# Patient Record
Sex: Female | Born: 1975 | Race: White | Hispanic: No | Marital: Married | Smoking: Never smoker
Health system: Southern US, Community
[De-identification: ages and names within clinical notes are randomized; demographics above are authoritative.]

## PROBLEM LIST (undated history)

## (undated) ENCOUNTER — Inpatient Hospital Stay (HOSPITAL_COMMUNITY): Payer: Self-pay

## (undated) ENCOUNTER — Emergency Department: Discharge status: Absent without leave | Payer: Self-pay

## (undated) ENCOUNTER — Inpatient Hospital Stay (HOSPITAL_COMMUNITY): Payer: Managed Care, Other (non HMO)

## (undated) DIAGNOSIS — F32A Depression, unspecified: Secondary | ICD-10-CM

## (undated) DIAGNOSIS — F329 Major depressive disorder, single episode, unspecified: Secondary | ICD-10-CM

## (undated) DIAGNOSIS — K219 Gastro-esophageal reflux disease without esophagitis: Secondary | ICD-10-CM

## (undated) DIAGNOSIS — F53 Postpartum depression: Secondary | ICD-10-CM

## (undated) DIAGNOSIS — T7492XA Unspecified child maltreatment, confirmed, initial encounter: Secondary | ICD-10-CM

## (undated) DIAGNOSIS — O99345 Other mental disorders complicating the puerperium: Secondary | ICD-10-CM

## (undated) DIAGNOSIS — Z8742 Personal history of other diseases of the female genital tract: Secondary | ICD-10-CM

## (undated) DIAGNOSIS — N6310 Unspecified lump in the right breast, unspecified quadrant: Secondary | ICD-10-CM

## (undated) DIAGNOSIS — Z87448 Personal history of other diseases of urinary system: Secondary | ICD-10-CM

## (undated) DIAGNOSIS — IMO0002 Reserved for concepts with insufficient information to code with codable children: Secondary | ICD-10-CM

## (undated) DIAGNOSIS — Z87898 Personal history of other specified conditions: Secondary | ICD-10-CM

## (undated) DIAGNOSIS — Z8619 Personal history of other infectious and parasitic diseases: Secondary | ICD-10-CM

## (undated) DIAGNOSIS — J302 Other seasonal allergic rhinitis: Secondary | ICD-10-CM

## (undated) DIAGNOSIS — E669 Obesity, unspecified: Secondary | ICD-10-CM

## (undated) DIAGNOSIS — N915 Oligomenorrhea, unspecified: Secondary | ICD-10-CM

## (undated) DIAGNOSIS — F419 Anxiety disorder, unspecified: Secondary | ICD-10-CM

## (undated) DIAGNOSIS — R112 Nausea with vomiting, unspecified: Secondary | ICD-10-CM

## (undated) DIAGNOSIS — M797 Fibromyalgia: Secondary | ICD-10-CM

## (undated) DIAGNOSIS — IMO0001 Reserved for inherently not codable concepts without codable children: Secondary | ICD-10-CM

## (undated) DIAGNOSIS — O1205 Gestational edema, complicating the puerperium: Secondary | ICD-10-CM

## (undated) DIAGNOSIS — G43909 Migraine, unspecified, not intractable, without status migrainosus: Secondary | ICD-10-CM

## (undated) DIAGNOSIS — J45909 Unspecified asthma, uncomplicated: Secondary | ICD-10-CM

## (undated) DIAGNOSIS — G5600 Carpal tunnel syndrome, unspecified upper limb: Secondary | ICD-10-CM

## (undated) DIAGNOSIS — T7840XA Allergy, unspecified, initial encounter: Secondary | ICD-10-CM

## (undated) DIAGNOSIS — R51 Headache: Secondary | ICD-10-CM

## (undated) DIAGNOSIS — Z87440 Personal history of urinary (tract) infections: Secondary | ICD-10-CM

## (undated) DIAGNOSIS — Z9889 Other specified postprocedural states: Secondary | ICD-10-CM

## (undated) HISTORY — DX: Allergy, unspecified, initial encounter: T78.40XA

## (undated) HISTORY — PX: OTHER SURGICAL HISTORY: SHX169

## (undated) HISTORY — DX: Personal history of other diseases of urinary system: Z87.448

## (undated) HISTORY — PX: BREAST ENHANCEMENT SURGERY: SHX7

## (undated) HISTORY — DX: Personal history of other infectious and parasitic diseases: Z86.19

## (undated) HISTORY — DX: Reserved for concepts with insufficient information to code with codable children: IMO0002

## (undated) HISTORY — DX: Postpartum depression: F53.0

## (undated) HISTORY — PX: ANKLE SURGERY: SHX546

## (undated) HISTORY — PX: CERVICAL CERCLAGE: SHX1329

## (undated) HISTORY — DX: Depression, unspecified: F32.A

## (undated) HISTORY — PX: DILATION AND CURETTAGE OF UTERUS: SHX78

## (undated) HISTORY — DX: Personal history of other diseases of the female genital tract: Z87.42

## (undated) HISTORY — PX: COSMETIC SURGERY: SHX468

## (undated) HISTORY — PX: BREAST SURGERY: SHX581

## (undated) HISTORY — DX: Oligomenorrhea, unspecified: N91.5

## (undated) HISTORY — DX: Unspecified asthma, uncomplicated: J45.909

## (undated) HISTORY — DX: Unspecified child maltreatment, confirmed, initial encounter: T74.92XA

## (undated) HISTORY — DX: Obesity, unspecified: E66.9

## (undated) HISTORY — DX: Gestational edema, complicating the puerperium: O12.05

## (undated) HISTORY — PX: NASAL SINUS SURGERY: SHX719

## (undated) HISTORY — PX: LYMPH NODE DISSECTION: SHX5087

## (undated) HISTORY — DX: Unspecified lump in the right breast, unspecified quadrant: N63.10

## (undated) HISTORY — DX: Personal history of urinary (tract) infections: Z87.440

## (undated) HISTORY — PX: AUGMENTATION MAMMAPLASTY: SUR837

## (undated) HISTORY — DX: Other mental disorders complicating the puerperium: O99.345

## (undated) HISTORY — DX: Major depressive disorder, single episode, unspecified: F32.9

## (undated) HISTORY — DX: Migraine, unspecified, not intractable, without status migrainosus: G43.909

## (undated) HISTORY — DX: Personal history of other specified conditions: Z87.898

---

## 2002-03-25 ENCOUNTER — Ambulatory Visit (HOSPITAL_COMMUNITY): Admission: RE | Admit: 2002-03-25 | Discharge: 2002-03-25 | Payer: Self-pay | Admitting: Orthopedic Surgery

## 2002-03-27 ENCOUNTER — Observation Stay (HOSPITAL_COMMUNITY): Admission: AD | Admit: 2002-03-27 | Discharge: 2002-03-28 | Payer: Self-pay | Admitting: Orthopedic Surgery

## 2002-03-27 ENCOUNTER — Encounter: Payer: Self-pay | Admitting: Orthopedic Surgery

## 2002-03-27 ENCOUNTER — Ambulatory Visit (HOSPITAL_COMMUNITY): Admission: RE | Admit: 2002-03-27 | Discharge: 2002-03-27 | Payer: Self-pay | Admitting: Orthopedic Surgery

## 2002-03-28 ENCOUNTER — Encounter: Payer: Self-pay | Admitting: Orthopedic Surgery

## 2002-03-31 ENCOUNTER — Ambulatory Visit (HOSPITAL_COMMUNITY): Admission: RE | Admit: 2002-03-31 | Discharge: 2002-04-01 | Payer: Self-pay | Admitting: Orthopedic Surgery

## 2002-11-24 DIAGNOSIS — R87619 Unspecified abnormal cytological findings in specimens from cervix uteri: Secondary | ICD-10-CM

## 2002-11-24 DIAGNOSIS — IMO0002 Reserved for concepts with insufficient information to code with codable children: Secondary | ICD-10-CM

## 2002-11-24 HISTORY — DX: Unspecified abnormal cytological findings in specimens from cervix uteri: R87.619

## 2002-11-24 HISTORY — DX: Reserved for concepts with insufficient information to code with codable children: IMO0002

## 2003-07-28 ENCOUNTER — Other Ambulatory Visit: Admission: RE | Admit: 2003-07-28 | Discharge: 2003-07-28 | Payer: Self-pay | Admitting: Obstetrics and Gynecology

## 2003-11-25 DIAGNOSIS — F419 Anxiety disorder, unspecified: Secondary | ICD-10-CM

## 2003-11-25 HISTORY — DX: Anxiety disorder, unspecified: F41.9

## 2003-12-25 ENCOUNTER — Inpatient Hospital Stay (HOSPITAL_COMMUNITY): Admission: AD | Admit: 2003-12-25 | Discharge: 2003-12-25 | Payer: Self-pay | Admitting: Obstetrics and Gynecology

## 2003-12-26 ENCOUNTER — Inpatient Hospital Stay (HOSPITAL_COMMUNITY): Admission: AD | Admit: 2003-12-26 | Discharge: 2003-12-26 | Payer: Self-pay | Admitting: Obstetrics and Gynecology

## 2003-12-29 ENCOUNTER — Inpatient Hospital Stay (HOSPITAL_COMMUNITY): Admission: AD | Admit: 2003-12-29 | Discharge: 2003-12-29 | Payer: Self-pay | Admitting: Obstetrics and Gynecology

## 2004-01-08 ENCOUNTER — Inpatient Hospital Stay (HOSPITAL_COMMUNITY): Admission: AD | Admit: 2004-01-08 | Discharge: 2004-01-08 | Payer: Self-pay | Admitting: Obstetrics and Gynecology

## 2004-01-11 ENCOUNTER — Encounter (INDEPENDENT_AMBULATORY_CARE_PROVIDER_SITE_OTHER): Payer: Self-pay | Admitting: *Deleted

## 2004-01-11 ENCOUNTER — Inpatient Hospital Stay (HOSPITAL_COMMUNITY): Admission: AD | Admit: 2004-01-11 | Discharge: 2004-01-13 | Payer: Self-pay | Admitting: Obstetrics and Gynecology

## 2004-01-23 ENCOUNTER — Encounter: Admission: RE | Admit: 2004-01-23 | Discharge: 2004-02-22 | Payer: Self-pay | Admitting: Obstetrics and Gynecology

## 2004-02-22 DIAGNOSIS — O1205 Gestational edema, complicating the puerperium: Secondary | ICD-10-CM

## 2004-02-22 HISTORY — DX: Gestational edema, complicating the puerperium: O12.05

## 2004-08-05 ENCOUNTER — Other Ambulatory Visit: Admission: RE | Admit: 2004-08-05 | Discharge: 2004-08-05 | Payer: Self-pay | Admitting: Obstetrics and Gynecology

## 2004-11-24 DIAGNOSIS — N6310 Unspecified lump in the right breast, unspecified quadrant: Secondary | ICD-10-CM

## 2004-11-24 HISTORY — DX: Unspecified lump in the right breast, unspecified quadrant: N63.10

## 2005-06-09 ENCOUNTER — Encounter: Admission: RE | Admit: 2005-06-09 | Discharge: 2005-06-09 | Payer: Self-pay | Admitting: Obstetrics and Gynecology

## 2005-11-24 DIAGNOSIS — N915 Oligomenorrhea, unspecified: Secondary | ICD-10-CM

## 2005-11-24 DIAGNOSIS — Z87898 Personal history of other specified conditions: Secondary | ICD-10-CM

## 2005-11-24 DIAGNOSIS — Z8742 Personal history of other diseases of the female genital tract: Secondary | ICD-10-CM

## 2005-11-24 HISTORY — DX: Personal history of other diseases of the female genital tract: Z87.42

## 2005-11-24 HISTORY — DX: Personal history of other specified conditions: Z87.898

## 2005-11-24 HISTORY — DX: Oligomenorrhea, unspecified: N91.5

## 2006-03-17 ENCOUNTER — Other Ambulatory Visit: Admission: RE | Admit: 2006-03-17 | Discharge: 2006-03-17 | Payer: Self-pay | Admitting: Obstetrics and Gynecology

## 2006-11-24 DIAGNOSIS — F53 Postpartum depression: Secondary | ICD-10-CM

## 2006-11-24 HISTORY — DX: Postpartum depression: F53.0

## 2007-03-12 ENCOUNTER — Ambulatory Visit (HOSPITAL_COMMUNITY): Admission: RE | Admit: 2007-03-12 | Discharge: 2007-03-12 | Payer: Self-pay | Admitting: Obstetrics and Gynecology

## 2007-03-19 ENCOUNTER — Inpatient Hospital Stay (HOSPITAL_COMMUNITY): Admission: AD | Admit: 2007-03-19 | Discharge: 2007-03-19 | Payer: Self-pay | Admitting: Obstetrics and Gynecology

## 2007-05-17 ENCOUNTER — Inpatient Hospital Stay (HOSPITAL_COMMUNITY): Admission: AD | Admit: 2007-05-17 | Discharge: 2007-05-17 | Payer: Self-pay | Admitting: Obstetrics and Gynecology

## 2007-06-24 ENCOUNTER — Inpatient Hospital Stay (HOSPITAL_COMMUNITY): Admission: AD | Admit: 2007-06-24 | Discharge: 2007-06-26 | Payer: Self-pay | Admitting: Obstetrics and Gynecology

## 2007-07-03 ENCOUNTER — Inpatient Hospital Stay (HOSPITAL_COMMUNITY): Admission: AD | Admit: 2007-07-03 | Discharge: 2007-07-03 | Payer: Self-pay | Admitting: Obstetrics and Gynecology

## 2007-07-07 ENCOUNTER — Inpatient Hospital Stay (HOSPITAL_COMMUNITY): Admission: AD | Admit: 2007-07-07 | Discharge: 2007-07-07 | Payer: Self-pay | Admitting: Obstetrics and Gynecology

## 2007-07-09 ENCOUNTER — Inpatient Hospital Stay (HOSPITAL_COMMUNITY): Admission: AD | Admit: 2007-07-09 | Discharge: 2007-07-09 | Payer: Self-pay | Admitting: Obstetrics and Gynecology

## 2007-07-23 ENCOUNTER — Inpatient Hospital Stay (HOSPITAL_COMMUNITY): Admission: AD | Admit: 2007-07-23 | Discharge: 2007-08-24 | Payer: Self-pay | Admitting: Obstetrics and Gynecology

## 2007-08-21 ENCOUNTER — Encounter (INDEPENDENT_AMBULATORY_CARE_PROVIDER_SITE_OTHER): Payer: Self-pay | Admitting: Obstetrics and Gynecology

## 2007-08-25 ENCOUNTER — Encounter: Admission: RE | Admit: 2007-08-25 | Discharge: 2007-09-24 | Payer: Self-pay | Admitting: Obstetrics and Gynecology

## 2007-09-25 ENCOUNTER — Encounter: Admission: RE | Admit: 2007-09-25 | Discharge: 2007-10-24 | Payer: Self-pay | Admitting: Obstetrics and Gynecology

## 2007-10-25 ENCOUNTER — Encounter: Admission: RE | Admit: 2007-10-25 | Discharge: 2007-11-24 | Payer: Self-pay | Admitting: Obstetrics and Gynecology

## 2007-11-25 ENCOUNTER — Encounter: Admission: RE | Admit: 2007-11-25 | Discharge: 2007-12-25 | Payer: Self-pay | Admitting: Obstetrics and Gynecology

## 2007-12-26 ENCOUNTER — Encounter: Admission: RE | Admit: 2007-12-26 | Discharge: 2008-01-22 | Payer: Self-pay | Admitting: Obstetrics and Gynecology

## 2008-01-23 ENCOUNTER — Encounter: Admission: RE | Admit: 2008-01-23 | Discharge: 2008-02-21 | Payer: Self-pay | Admitting: Obstetrics and Gynecology

## 2008-02-23 ENCOUNTER — Encounter: Admission: RE | Admit: 2008-02-23 | Discharge: 2008-03-23 | Payer: Self-pay | Admitting: Obstetrics and Gynecology

## 2008-03-24 ENCOUNTER — Encounter: Admission: RE | Admit: 2008-03-24 | Discharge: 2008-04-23 | Payer: Self-pay | Admitting: Obstetrics and Gynecology

## 2008-04-24 ENCOUNTER — Encounter: Admission: RE | Admit: 2008-04-24 | Discharge: 2008-05-23 | Payer: Self-pay | Admitting: Obstetrics and Gynecology

## 2008-05-24 ENCOUNTER — Encounter: Admission: RE | Admit: 2008-05-24 | Discharge: 2008-06-23 | Payer: Self-pay | Admitting: Obstetrics and Gynecology

## 2008-06-24 ENCOUNTER — Encounter: Admission: RE | Admit: 2008-06-24 | Discharge: 2008-07-24 | Payer: Self-pay | Admitting: Obstetrics and Gynecology

## 2009-05-06 IMAGING — US US OB TRANSVAGINAL
1 series · 14 of 28 positions shown · non-contrast
Comparison: none

OBSTETRICAL ULTRASOUND:

 This ultrasound exam was performed in the [HOSPITAL] Ultrasound Department.  The OB US report was generated in the AS system, and faxed to the ordering physician.  This report is also available in [REDACTED] PACS.

[Series 1: us ob transvaginal · 0.33mm/px · 14 of 31 slices shown]
[im 2/31]
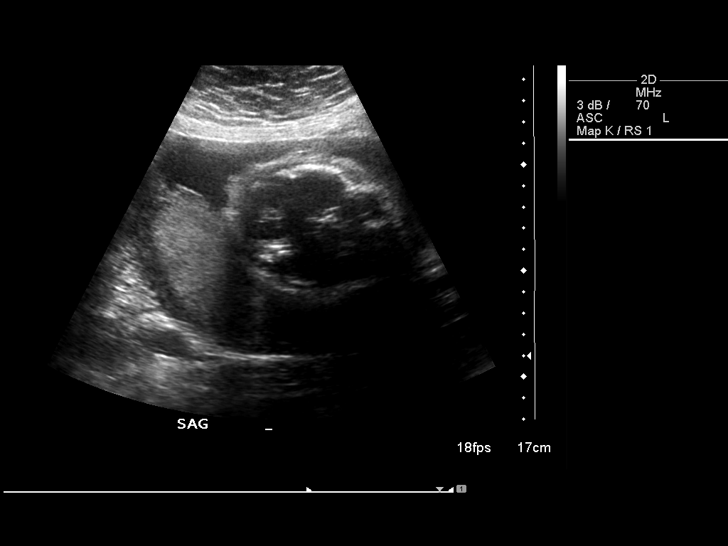
[im 4/31]
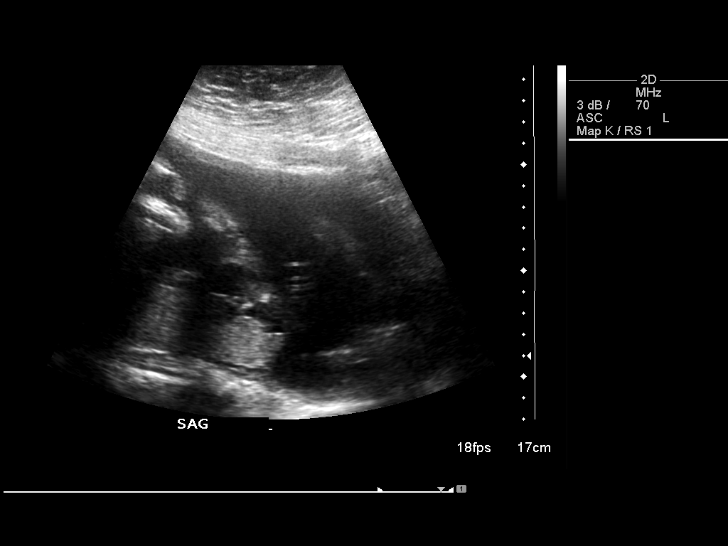
[im 6/31]
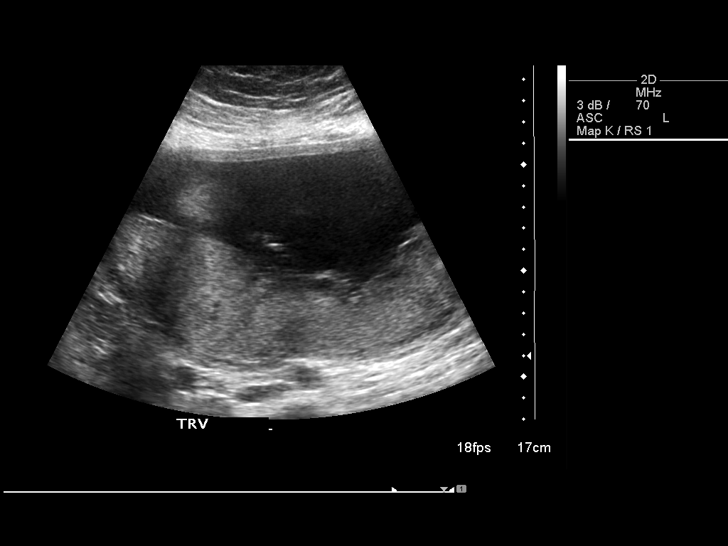
[im 8/31]
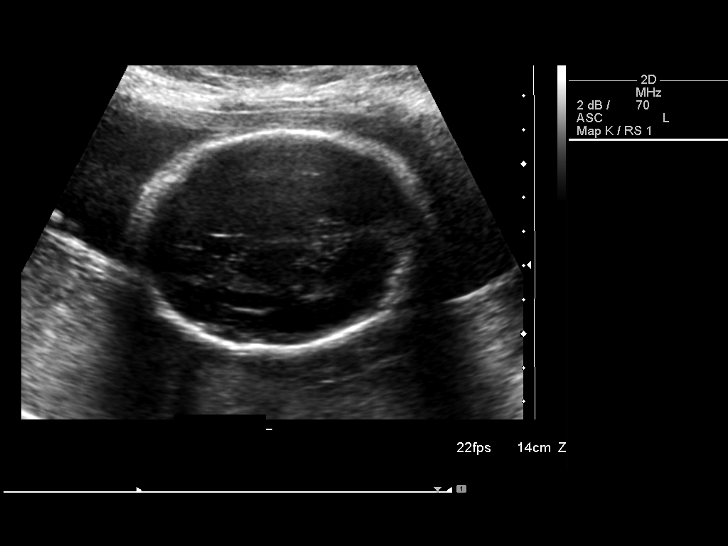
[im 11/31]
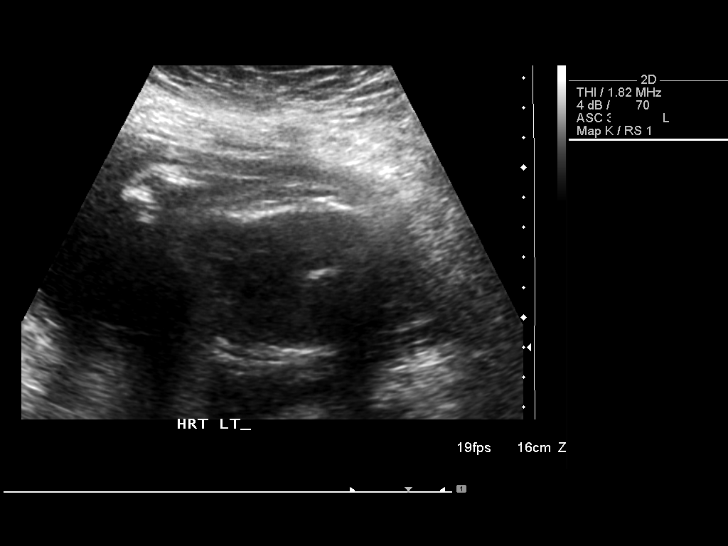
[im 13/31]
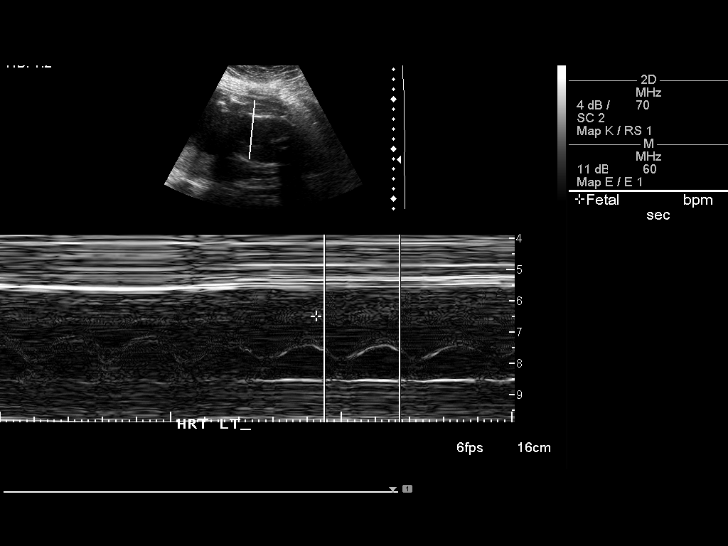
[im 15/31]
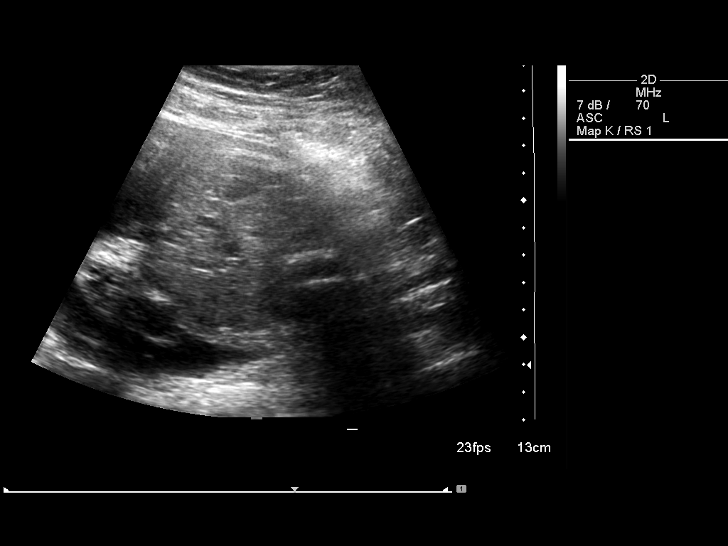
[im 17/31]
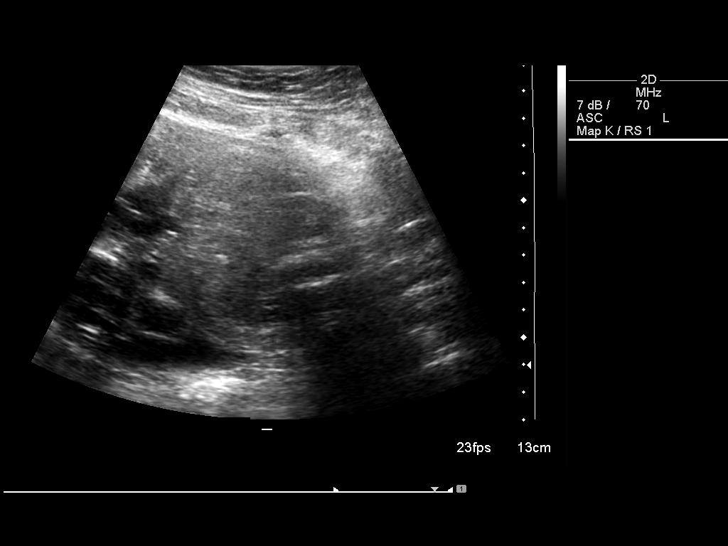
[im 19/31]
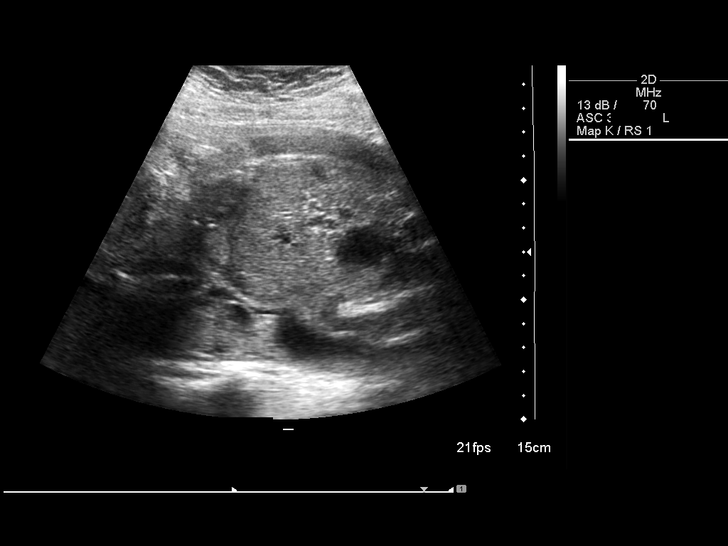
[im 22/31]
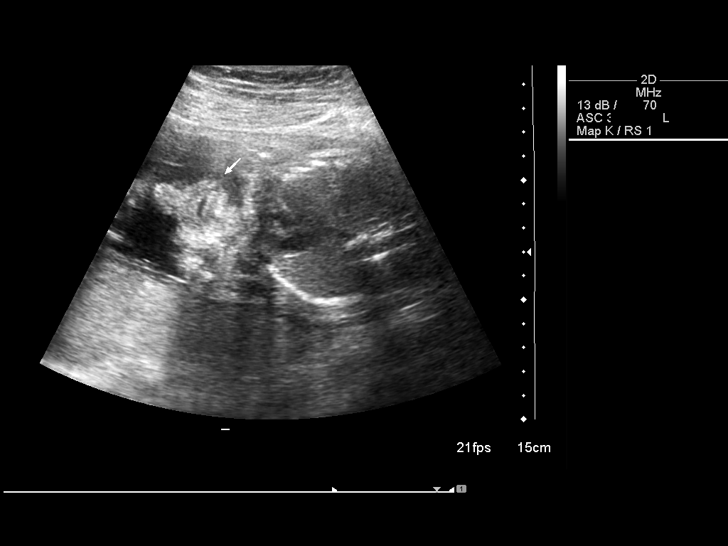
[im 24/31]
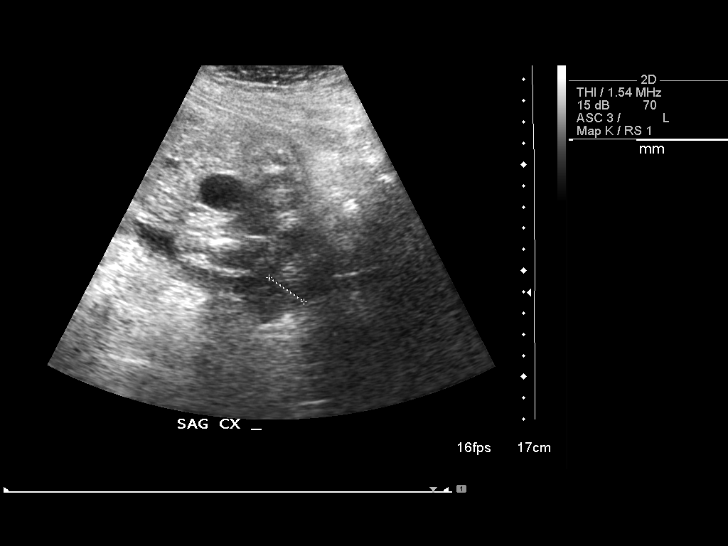
[im 26/31]
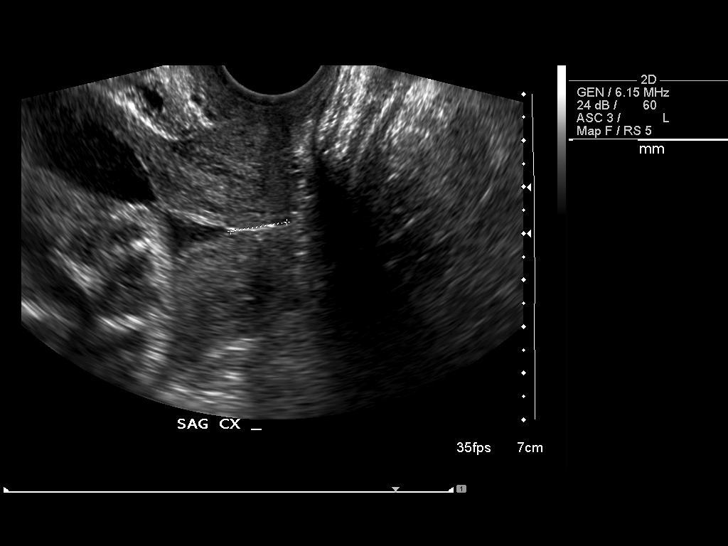
[im 28/31]
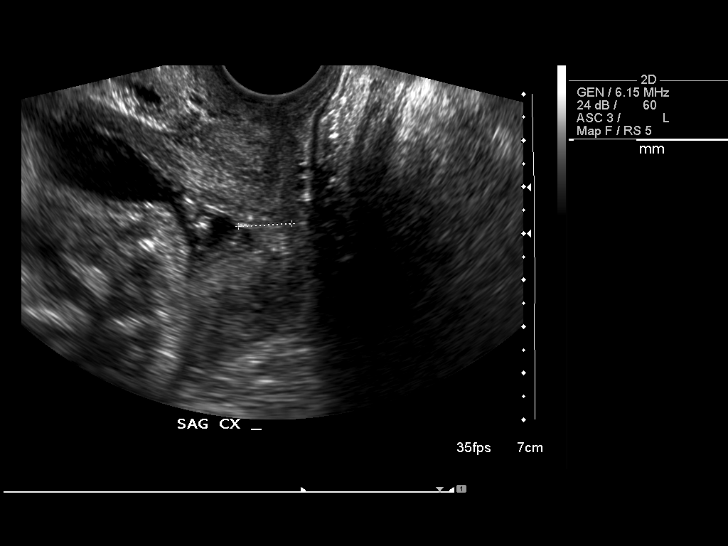
[im 31/31]
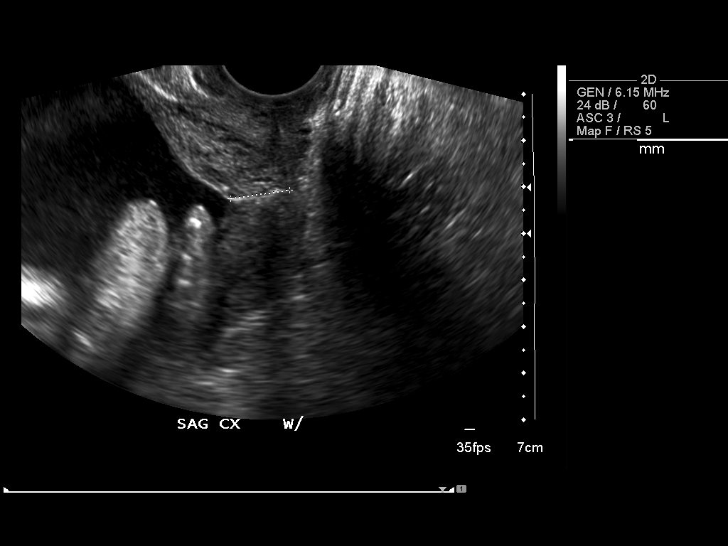

[14 of 28 positions shown; findings below may reference images not displayed]

IMPRESSION: See AS Obstetric US report.

## 2009-09-10 ENCOUNTER — Encounter: Admission: RE | Admit: 2009-09-10 | Discharge: 2009-09-10 | Payer: Self-pay | Admitting: Obstetrics and Gynecology

## 2010-04-28 ENCOUNTER — Ambulatory Visit: Payer: Self-pay | Admitting: Diagnostic Radiology

## 2010-04-28 ENCOUNTER — Emergency Department (HOSPITAL_BASED_OUTPATIENT_CLINIC_OR_DEPARTMENT_OTHER): Admission: EM | Admit: 2010-04-28 | Discharge: 2010-04-28 | Payer: Self-pay | Admitting: Emergency Medicine

## 2010-06-10 ENCOUNTER — Inpatient Hospital Stay (HOSPITAL_COMMUNITY): Admission: AD | Admit: 2010-06-10 | Discharge: 2010-06-10 | Payer: Self-pay | Admitting: Obstetrics & Gynecology

## 2011-01-15 ENCOUNTER — Encounter: Payer: Self-pay | Admitting: Family Medicine

## 2011-01-15 ENCOUNTER — Ambulatory Visit (INDEPENDENT_AMBULATORY_CARE_PROVIDER_SITE_OTHER): Payer: Managed Care, Other (non HMO) | Admitting: Family Medicine

## 2011-01-15 DIAGNOSIS — R300 Dysuria: Secondary | ICD-10-CM | POA: Insufficient documentation

## 2011-01-15 DIAGNOSIS — F959 Tic disorder, unspecified: Secondary | ICD-10-CM | POA: Insufficient documentation

## 2011-01-15 DIAGNOSIS — L738 Other specified follicular disorders: Secondary | ICD-10-CM

## 2011-01-15 LAB — CONVERTED CEMR LAB
Bilirubin Urine: NEGATIVE
Glucose, Urine, Semiquant: NEGATIVE
Ketones, urine, test strip: NEGATIVE
pH: 7

## 2011-01-16 ENCOUNTER — Encounter: Payer: Self-pay | Admitting: Family Medicine

## 2011-01-21 NOTE — Assessment & Plan Note (Signed)
Summary: LYMPHNODE UNDER ARM AND EYE TWITCHING/NH rm 4   Vital Signs:  Patient Profile:   35 Years Old Female CC:      eye twitching, lymph node swollen Height:     68 inches Weight:      245 pounds O2 Sat:      99 % O2 treatment:    Room Air Temp:     98.9 degrees F oral Pulse rate:   80 / minute Resp:     16 per minute BP sitting:   129 / 55  (left arm) Cuff size:   regular  Vitals Entered By: Clemens Catholic LPN (January 15, 2011 9:11 AM)              Vision Screening: Left eye w/o correction: 20 / 20 Right Eye w/o correction: 20 / 20 Both eyes w/o correction:  20/ 20        Vision Entered By: Clemens Catholic LPN (January 15, 2011 10:17 AM)    Updated Prior Medication List: PROTONIX 40 MG TBEC (PANTOPRAZOLE SODIUM)   Current Allergies: No known allergies History of Present Illness Chief Complaint: eye twitching, lymph node swollen History of Present Illness:  Subjective:  Patient presents with several problems: 1)  She has noticed her left eyelids twitching intermittently for about 5 days without eye discomfort or changes in vision.  No other neurologic symptoms.  She reports that she had a severe headache in January and visited ER.  CT scan of head at that time was normal.  2)  She has noticed some soreness in her left axilla superficially.  She had some drainage from a small lesion in that area several weeks ago but a wound culture was negative and symptoms resolved at that time.  She states that at age 39 she had a cat scratch and subsequently underwent resection of an enlarged left axillary node.  No recent cat injuries.  She feels well.  No fevers, chills, and sweats  3)  She believes she is developing an early cystitis.  She has had mild dysuria, urgency, and frequency.  No back ache, abdominal pain or pelvic pain.  Last menstrual period 3.5 weeks ago and normal.  REVIEW OF SYSTEMS Constitutional Symptoms      Denies fever, chills, night sweats, weight  loss, weight gain, and fatigue.  Eyes       Complains of eye pain.      Denies change in vision, eye discharge, glasses, contact lenses, and eye surgery. Ear/Nose/Throat/Mouth       Denies hearing loss/aids, change in hearing, ear pain, ear discharge, dizziness, frequent runny nose, frequent nose bleeds, sinus problems, sore throat, hoarseness, and tooth pain or bleeding.  Respiratory       Denies dry cough, productive cough, wheezing, shortness of breath, asthma, bronchitis, and emphysema/COPD.  Cardiovascular       Denies murmurs, chest pain, and tires easily with exhertion.    Gastrointestinal       Denies stomach pain, nausea/vomiting, diarrhea, constipation, blood in bowel movements, and indigestion. Genitourniary       Denies painful urination, kidney stones, and loss of urinary control. Neurological       Denies paralysis, seizures, and fainting/blackouts. Musculoskeletal       Denies muscle pain, joint pain, joint stiffness, decreased range of motion, redness, swelling, muscle weakness, and gout.  Skin       Denies bruising, unusual mles/lumps or sores, and hair/skin or nail changes.  Psych  Denies mood changes, temper/anger issues, anxiety/stress, speech problems, depression, and sleep problems. Blood-Lymph       Complains of unexplained lumps. Other Comments: pt c/o LT eye twitching x 6 days, lymph node under LT arm tender( she was seen at [facility] 12/11' and had it I &D negative culture), and she is also having vaginal irritation,burning and a little cramping. she is trying to get pregnant but does not want [place] to do a UPT.   Past History:  Past Medical History: none  Past Surgical History: lymph node removal  1981- cat scratch fever  Family History: none  Social History: Never Smoked Alcohol use-yes 1 per mth Drug use-no Smoking Status:  never Drug Use:  no   Objective:  Appearance:  Patient is obese but otherwise appears healthy, stated age,  and in no acute distress  Eyes:  Pupils are equal, round, and reactive to light and accomdation.  Extraocular movement is intact.  Conjunctivae are not inflamed.  No eyelid tics are present.  No lid tenderness or swelling.  Fundi normal.  No nystagmus.  Vision normal Ears:  Canals normal.  Tympanic membranes normal.   Nose:  Normal septum.  Normal turbinates, mildly congested.   No sinus tenderness present.  Pharynx:  Normal  Neck:  Supple.  No adenopathy is present.  No thyromegaly is present  Lungs:  Clear to auscultation.  Breath sounds are equal.  Heart:  Regular rate and rhythm without murmurs, rubs, or gallops.  Abdomen:  Obese, nontender without masses or hepatosplenomegaly.  Bowel sounds are present.  No CVA or flank tenderness.  Extremities:  No edema.  Neurologic:  Cranial nerves 2 through 12 are normal.  Patellar, achilles, and elbow reflexes are normal.  Cerebellar function is intact.    Skin:  In the left axilla there is some vague superficial tenderness but no erythema or swelling.  No adenopathy is palpated.  Note that patient shaves both axillae.  No tenderness right axillae; no palpable nodes. urinalysis (dipstick): trace blood, 1+ leuks Assessment New Problems: FOLLICULITIS (ICD-704.8) TIC (ICD-307.20) DYSURIA (ICD-788.1)  NORMAL EYE AND NEUROLOGIC EXAM.  ? REFRACTIVE ERROR SUSPECT MILD AXILLARY FOLLICULITIS  Plan New Medications/Changes: DIFLUCAN 150 MG TABS (FLUCONAZOLE) One by mouth as a single dose.  May repeat in 3 to 4 days.  #1 x 1, 01/15/2011, Donna Christen MD CEPHALEXIN 500 MG CAPS (CEPHALEXIN) One by mouth two times a day  #20 x 0, 01/15/2011, Donna Christen MD  New Orders: T-Culture, Urine [[id number]] Urinalysis [CPT-81003] New Patient Level IV [99204] Planning Comments:   Urine culture pending  Begin Keflex (adjust accordingly after urine culture available) to cover folliculitis and UTI Recommend follow-up with ophthalmologist.   The patient and/or  caregiver has been counseled thoroughly with regard to medications prescribed including dosage, schedule, interactions, rationale for use, and possible side effects and they verbalize understanding.  Diagnoses and expected course of recovery discussed and will return if not improved as expected or if the condition worsens. Patient and/or caregiver verbalized understanding.  Prescriptions: DIFLUCAN 150 MG TABS (FLUCONAZOLE) One by mouth as a single dose.  May repeat in 3 to 4 days.  #1 x 1   Entered and Authorized by:   Donna Christen MD   Signed by:   Donna Christen MD on 01/15/2011   Method used:   Print then Give to Patient   RxID:   [id number] CEPHALEXIN 500 MG CAPS (CEPHALEXIN) One by mouth two times a day  #20 x  0   Entered and Authorized by:   Donna Christen MD   Signed by:   Donna Christen MD on 01/15/2011   Method used:   Print then Give to Patient   RxID:   [id number]   Orders Added: 1)  T-Culture, Urine [[id number]] 2)  Urinalysis [CPT-81003] 3)  New Patient Level IV [99204]    Laboratory Results   Urine Tests  Date/Time Received: January 15, 2011 9:25 AM  Date/Time Reported: January 15, 2011 9:25 AM   Routine Urinalysis   Color: yellow Appearance: Clear Glucose: negative   (Normal Range: Negative) Bilirubin: negative   (Normal Range: Negative) Ketone: negative   (Normal Range: Negative) Spec. Gravity: 1.025   (Normal Range: 1.003-1.035) Blood: trace-intact   (Normal Range: Negative) pH: 7.0   (Normal Range: 5.0-8.0) Protein: negative   (Normal Range: Negative) Urobilinogen: 0.2   (Normal Range: 0-1) Nitrite: negative   (Normal Range: Negative) Leukocyte Esterace: 1+   (Normal Range: Negative)

## 2011-02-08 LAB — URINE CULTURE: Colony Count: 30000

## 2011-02-08 LAB — URINALYSIS, ROUTINE W REFLEX MICROSCOPIC
Ketones, ur: NEGATIVE mg/dL
Nitrite: NEGATIVE
Protein, ur: NEGATIVE mg/dL
pH: 6.5 (ref 5.0–8.0)

## 2011-02-08 LAB — WET PREP, GENITAL: Yeast Wet Prep HPF POC: NONE SEEN

## 2011-02-08 LAB — URINE MICROSCOPIC-ADD ON

## 2011-02-10 LAB — POCT CARDIAC MARKERS
CKMB, poc: 2 ng/mL (ref 1.0–8.0)
Troponin i, poc: <0.05 ng/mL (ref 0.00–0.09)

## 2011-02-10 LAB — DIFFERENTIAL
Basophils Relative: 1 % (ref 0–1)
Eosinophils Relative: 3 % (ref 0–5)
Lymphs Abs: 2 10*3/uL (ref 0.7–4.0)
Neutrophils Relative %: 57 % (ref 43–77)

## 2011-02-10 LAB — BASIC METABOLIC PANEL
BUN: 16 mg/dL (ref 6–23)
CO2: 29 mEq/L (ref 19–32)
GFR calc Af Amer: >60 mL/min (ref >60)
GFR calc non Af Amer: >60 mL/min (ref >60)
Glucose, Bld: 78 mg/dL (ref 70–99)
Potassium: 4.4 mEq/L (ref 3.5–5.1)

## 2011-02-10 LAB — CBC
HCT: 41.2 % (ref 36.0–46.0)
Hemoglobin: 13.8 g/dL (ref 12.0–15.0)
MCHC: 33.5 g/dL (ref 30.0–36.0)
MCV: 85.7 fL (ref 78.0–100.0)
RBC: 4.81 MIL/uL (ref 3.87–5.11)

## 2011-02-10 LAB — D-DIMER, QUANTITATIVE: D-Dimer, Quant: 0.48 ug/mL-FEU (ref 0.00–0.48)

## 2011-02-23 ENCOUNTER — Inpatient Hospital Stay (HOSPITAL_COMMUNITY)
Admission: AD | Admit: 2011-02-23 | Discharge: 2011-02-23 | Disposition: A | Discharge status: Home / Self Care | Payer: Managed Care, Other (non HMO) | Source: Ambulatory Visit | Attending: Obstetrics and Gynecology | Admitting: Obstetrics and Gynecology

## 2011-02-23 ENCOUNTER — Inpatient Hospital Stay (HOSPITAL_COMMUNITY): Payer: Managed Care, Other (non HMO)

## 2011-02-23 DIAGNOSIS — O209 Hemorrhage in early pregnancy, unspecified: Secondary | ICD-10-CM | POA: Insufficient documentation

## 2011-02-23 LAB — URINALYSIS, ROUTINE W REFLEX MICROSCOPIC
Bilirubin Urine: NEGATIVE
Glucose, UA: NEGATIVE mg/dL
Hgb urine dipstick: NEGATIVE
Nitrite: NEGATIVE
Protein, ur: NEGATIVE mg/dL
Specific Gravity, Urine: 1.02 (ref 1.005–1.030)
pH: 7.5 (ref 5.0–8.0)

## 2011-02-23 LAB — WET PREP, GENITAL
Clue Cells Wet Prep HPF POC: NONE SEEN
Trich, Wet Prep: NONE SEEN
Yeast Wet Prep HPF POC: NONE SEEN

## 2011-02-24 LAB — URINE CULTURE

## 2011-02-24 LAB — GC/CHLAMYDIA PROBE AMP, GENITAL
Chlamydia, DNA Probe: NEGATIVE
GC Probe Amp, Genital: NEGATIVE

## 2011-03-21 ENCOUNTER — Ambulatory Visit (HOSPITAL_COMMUNITY)
Admission: RE | Admit: 2011-03-21 | Discharge: 2011-03-21 | Disposition: A | Discharge status: Home / Self Care | Payer: Managed Care, Other (non HMO) | Source: Ambulatory Visit | Attending: Obstetrics and Gynecology | Admitting: Obstetrics and Gynecology

## 2011-03-21 ENCOUNTER — Ambulatory Visit (HOSPITAL_COMMUNITY): Payer: Managed Care, Other (non HMO)

## 2011-03-21 ENCOUNTER — Other Ambulatory Visit: Payer: Self-pay | Admitting: Obstetrics and Gynecology

## 2011-03-21 DIAGNOSIS — O021 Missed abortion: Secondary | ICD-10-CM | POA: Insufficient documentation

## 2011-03-21 DIAGNOSIS — O30009 Twin pregnancy, unspecified number of placenta and unspecified number of amniotic sacs, unspecified trimester: Secondary | ICD-10-CM | POA: Insufficient documentation

## 2011-03-21 LAB — CBC
HCT: 42.5 % (ref 36.0–46.0)
Platelets: 272 10*3/uL (ref 150–400)
RDW: 13.3 % (ref 11.5–15.5)
WBC: 8 10*3/uL (ref 4.0–10.5)

## 2011-04-03 NOTE — Op Note (Signed)
  NAME:  Gina Schneider, Gina Schneider NO.:  [id number]  MEDICAL RECORD NO.:  [id number]          PATIENT TYPE:  LOCATION:                                 FACILITY:  PHYSICIAN:  Karma Ansley A. Melissia Lahman, M.D. DATE OF BIRTH:  17-Nov-1976  DATE OF PROCEDURE: DATE OF DISCHARGE:                              OPERATIVE REPORT   PREOPERATIVE DIAGNOSIS:  Fetal demise at 50 weeks' gestation with twins.  POSTOPERATIVE DIAGNOSIS:  Fetal demise at 1 weeks' gestation with twins.  PROCEDURE:  D and E under ultrasound guidance.  SURGEON:  Buckley Bradly A. Karolyne Timmons, MD.  ASSISTANT:  There are no assistants.  ANESTHESIA:  General and local.  FINDINGS:  Twin demise on ultrasound and products of conception sent to pathology.  ESTIMATED BLOOD LOSS:  200 mL.  There were no complications.  The patient went to PACU in stable condition.  PROCEDURE IN DETAIL:  The patient was taken to the operating room where she was given anesthesia, placed in dorsal lithotomy position, prepped and draped in normal sterile fashion.  A bivalve speculum was placed into the vagina.  The anterior lip of the cervix was grasped with single- tooth tenaculum.  The cervix was infiltrated with 20 mL of 1% lidocaine. The cervix was dilated with Pratt dilators up to 29.  A size 9 suction curettage was placed into the uterus and under ultrasound guidance. Products of conception was removed from the uterus until the stripe was thin.  A sharp curette was placed onto the uterus and all four walls were found to be gritty texture.  The curettage was placed back into the uterine cavity and the blood and just fluid was removed and with no retained products was there.  All instruments were removed from the uterus and vagina.  Tenaculum site was made hemostatic with pressure and silver nitrate.  Sponge, lap, and needle counts were correct.  The patient went to recovery room in stable condition.     Sheritta Deeg A. Normand Sloop, M.D.    NAD/MEDQ   D:  03/21/2011  T:  03/22/2011  Job:  [id number]  Electronically Signed by Jaymes Graff M.D. on 04/03/2011 11:16:17 PM

## 2011-04-08 NOTE — Discharge Summary (Signed)
NAME:  Gina Schneider, Gina Schneider NO.:  [id number]   MEDICAL RECORD NO.:  [id number]          PATIENT TYPE:  INP   LOCATION:  9304                          FACILITY:  [facility]   PHYSICIAN:  Crist Fat. Rivard, M.D. DATE OF BIRTH:  1976/09/26   DATE OF ADMISSION:  07/23/2007  DATE OF DISCHARGE:  08/24/2007                               DISCHARGE SUMMARY   ADMISSION DIAGNOSES:  1. Intrauterine pregnancy at 29-5/7th weeks.  2. Pre-term labor with cervical change.   DISCHARGE DIAGNOSES:  1. Status post cesarean delivery of a baby at 33-6/[redacted] weeks gestation      and breech presentation and pre-term premature rupture of      membranes.  2. Obesity.  3. Status post a cesarean delivery of a female infant weighing unknown      weight, with Apgars of 7 and 9.   HOSPITAL PROCEDURE:  1. Electronic fetal monitoring.  2. Tocolysis.  3. Ultrasound.  4. Spinal anesthesia.  5. Primary low transverse cesarean section.   HOSPITAL COURSE:  The patient was admitted with pre-term cervical change  with the cervix dilated at 1 cm and 75% effaced.  She was felt to have  incompetent cervix.  She was placed on bedrest and had intermittent  episodes of contractions and pressure.  She was treated for a urinary  tract infection with Septra.  Fetal fibronectin was positive.  She had  an ultrasound done on July 31, 2007, showing breech presentation  with a normal AFI.  The cervix was found to be 3 cm on July 31, 2007.  Bedrest was continued.  Tocolysis with magnesium sulfate was  started and later able to be discontinued.  She had some chest pain no  August 02, 2007, but a spiral CT was negative.  She was started on  Procardia after the magnesium sulfate was stopped.  The cervix remained  at 3 cm.  She continued on Procardia due to intolerance of magnesium  sulfate.  The magnesium sulfate was thought to be the cause of the chest  pain that she had reported earlier.  She complained of pain and  was  treated with Percocet for that pelvic and back pain.  Physical therapy  was done while she was on bedrest for her pelvic pain.  The baby  continued to be footling breech.  The patient was kept on bedrest, but  plans were made for external version.   On August 21, 2007, the patient began having some leaking and was  found to have ruptured membranes with the cervix 5 cm.  The decision was  made to proceed with a cesarean section, which was performed under  spinal anesthesia on August 21, 2007, by Dr. Janine Limbo.  She  had a primary low transverse cesarean section of a female with Apgars of  7 and 9.  The cord pH was 7.26.  The baby was taken to the NICU and the  mother was taken to recovery, and then to the third floor.  On  postoperative day number one she was doing well and tolerating a regular  diet and was up and about, visiting the baby in the nursery.  On  postoperative day number two, she was requesting Reglan for milk  production, although she was pumping well.  She was also started on  hydrochlorothiazide for pedal edema.   DISCHARGE PHYSICAL EXAMINATION:  On postoperative day number three, she  was ready to go home.  The baby was stable in the NICU.  The patient was  tolerating her pain well with Motrin only.  Her vital signs were stable.  Her chest was clear.  Heart with regular rate and rhythm.  Abdomen was  soft, appropriately tender.  The incision was clean, dry and intact.  The JP was removed.  Lochia was normal.  The extremities were normal.  She was deemed to receive full benefit for her hospital stay and was  discharged home.   DISCHARGE MEDICATIONS:  1. Motrin 600 mg p.o. q.6h. p.r.n.  2. Hydrochlorothiazide one p.o. daily.  3. Reglan 10 mg p.o. four times daily.   DISCHARGE LABORATORY DATA:  Hemoglobin 12.   DISCHARGE INSTRUCTIONS:  Per the discharge handout.   FOLLOWUP:  In six weeks or p.r.n.   CONDITION ON DISCHARGE:  Good.       [name], C.N.M.      Crist Fat Rivard, M.D.  Electronically Signed    MLW/MEDQ  D:  08/24/2007  T:  08/24/2007  Job:  [id number]

## 2011-04-08 NOTE — Discharge Summary (Signed)
NAME:  TAKELA, VARDEN NO.:  [id number]   MEDICAL RECORD NO.:  [id number]          PATIENT TYPE:  INP   LOCATION:  9157                          FACILITY:  [facility]   PHYSICIAN:  Janine Limbo, M.D.DATE OF BIRTH:  28-Aug-1976   DATE OF ADMISSION:  06/24/2007  DATE OF DISCHARGE:  06/26/2007                               DISCHARGE SUMMARY   ADMITTING DIAGNOSES:  1. Intrauterine pregnancy at 25 and 3/7 weeks.  2. Preterm labor with positive fetal fibronectin and cervical      effacement.   DISCHARGE DIAGNOSES:  1. Intrauterine pregnancy at 25 and 3/7 weeks.  2. Preterm labor with positive fetal fibronectin and cervical      effacement.  3. Dynamic cervix.   PROCEDURES:  Betamethasone times two doses.   HOSPITAL COURSE:  Ms. Rayo is a 35 year old gravida 2, para 0-1-0-1,  at 49 and 3/7 weeks who presented to the office on July 31 with pelvic  pressure.  Cervix was shortened on ultrasound and fetal fibronectin was  positive.  Cervix was closed on office exam.  Monitoring fetal heart  rate had been reassuring and no contractions were noted.  Her history  had been remarkable for:  1. First trimester spotting.  2. History of previous preterm labor and preterm delivery at 34 weeks.  3. Previous vacuum delivery.  4. Asthma.  5. Gastroesophageal reflux disease.  6. Migraines.  7. Obesity.  8. Abnormal Pap.  9. Positive fetal fibronectin.   On admission, an ultrasound was done showing cervical length of 1.82 and  a growth of 91% percentile and breech presentation.  She had been on 17P  at 16 weeks, beginning from 16 weeks.  She also had a history of  membranous pseudocolitis which was being treated with the cholestyramine  powder.  No contractions were noted.  Steroids were begun and patient  received two doses.  She had an ultrasound on August 1 showing breech  presentation, cervix of 1.2 cm but no contractions.  A second dose of  betamethasone was completed.   On August 2, the patient had another  ultrasound showing cervical length of 2.5 cm.  It did show funneling to  1.7 with pressure.  Fetus was still breech, patient was having no  contractions, her vital signs were stable and heart rate was reassuring  for the estimated gestational age.  Evaluation was made of the patient  by Dr. Stefano Gaul and the decision was made to send the patient home.   DISCHARGE INSTRUCTIONS:  Patient is to continue to be bedrest and pelvic  rest.  She is to notify us for more than 4 or 5 contractions in an hour.   DISCHARGE MEDICATIONS:  1. Cholestyramine powder 4 g daily.  2. Prenatal vitamin one p.o. daily.   DISCHARGE FOLLOWUP:  Will occur on Tuesday at [facility],  with the office calling the patient to make that appointment.  Patient  is also to call for any increase in preterm labor symptoms or any other  concerns.      Renaldo Reel Emilee Hero, C.N.M.  Janine Limbo, M.D.  Electronically Signed    VLL/MEDQ  D:  06/26/2007  T:  06/27/2007  Job:  [id number]
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
## 2011-04-08 NOTE — H&P (Signed)
NAME:  Gina Schneider, Gina Schneider NO.:  [id number]   MEDICAL RECORD NO.:  [id number]          PATIENT TYPE:  INP   LOCATION:  9158                          FACILITY:  [facility]   PHYSICIAN:  Crist Fat. Rivard, M.D. DATE OF BIRTH:  1976/03/10   DATE OF ADMISSION:  07/23/2007  DATE OF DISCHARGE:                              HISTORY & PHYSICAL   This is a 35 year old gravida 2, para 0-1-0-1 at 41 and 5/7th's weeks,  who presents for direct admission for pre-term labor with increased  cervical effacement today and a history of positive fetal fibronectin.  Pregnancy has been followed by Dr. Estanislado Pandy and remarkable for:  1. History of pre-term birth at 50 weeks.  2. Pre-term labor this pregnancy.  3. History of cryosurgery.  4. C. difficile colitis, which is now resolved.  5. Asthma.  6. History of breast augmentation.  7. First trimester spotting.  8. GERD.  9. Migraines.  10.Obesity.   ALLERGIES:  AUGMENTIN AND CECLOR.   OBSTETRIC HISTORY:  Remarkable for vaginal delivery, which was vacuum-  assisted in 2005 of a female infant at 39 and 5/7th's weeks gestation,  weighing 6 pounds 5 ounces, remarkable for pre-term labor.   MEDICAL HISTORY:  Remarkable for a history of pre-term labor, history of  abnormal Pap with cryosurgery, childhood varicella, childhood asthma,  migraines, GERD, history of anxiety and panic attacks.   SURGICAL HISTORY:  Remarkable for lymph node removed in 1981, heel spurs  removed in 1993 and 1994.  Left ankle surgery in 2003, breast  augmentation 2004, breast augmentation repair in 2004, sinus surgery in  2007, implant replacement in 2007.   FAMILY HISTORY:  Remarkable for 2 grandfathers with MI, one grandfather  with hypertension and varicose veins, grandmother and mother with  diabetes, grandmother with stroke, aunt with MS, grandmother with skin  cancer.  Mother and sister with depression.   GENETIC HISTORY:  Remarkable for father of the baby with heart  murmur  and patient's brother with heart murmur.  Cousin with Down's Syndrome,  family history of twins, and family history with non-asymmetrical  isotonia.   SOCIAL HISTORY:  The patient is married to [name], who is  involved and supportive.  She is of the [place] faith.  She works in  business, and she denies any alcohol, tobacco or drug use.   PRENATAL LABS:  Hemoglobin 14.1, platelets 303, blood type O-positive,  antibody screen negative, RPR non-reactive, Rubella immune, Hepatitis  negative, HIV negative.   HISTORY OF CURRENT PREGNANCY:  The patient entered care at 10 weeks'  gestation.  She had a ultrasound by that time for viability, which was  normal.  She had some first trimester bleeding which resolved.  She  started 17p injections at 16 weeks.  She had an ultrasound at 18 weeks  that was normal.  She did have a Previa in the first trimester, which  resolved by 19 weeks.  She had some diarrhea in June, which was  diagnosed as C. difficile and was treated and resolved.  She had some  pre-term labor at 25 weeks,  and fetal fibronectin was done.  Cervix was  closed and 30% effaced at that time, with cervical length of 1.82, and  fetal fibronectin was positive at that time.  She has since been on  bedrest and terbutaline tocolysis and was unable to maintain bed rest at  home and was admitted today for further effacement of the cervix.   OBJECTIVE DATA:  VITAL SIGNS:  Stable, afebrile.  HEENT:  Within normal limits.  NECK:  Thyroid normal, not enlarged.  CHEST:  Clear to auscultation.  HEART:  Regular rate and rhythm.  ABDOMEN:  Gravid at 30 cm.  Fetal heart rate is reassuring with mild  contractions every 4 to 10 minutes, which are not felt by the patient.  Cervix is 1 centimeter and 75% effaced in the office, per Dr. Su Hilt.  EXTREMITIES:  Within normal limits.   ASSESSMENT:  1. Intrauterine pregnancy at 29-5/7ths' weeks.  2. Pre-term labor with cervical  change.   PLAN:  1. Admit to antenatal unit, per Dr. Estanislado Pandy.  2. Neonatal consultation.  3. Routine antenatal orders with bed rest.  4. M.D. to follow.      [name], C.N.M.      Crist Fat Rivard, M.D.  Electronically Signed    MLW/MEDQ  D:  07/23/2007  T:  07/23/2007  Job:  [id number]

## 2011-04-08 NOTE — Op Note (Signed)
NAME:  [name], [name] NO.:  [id number]   MEDICAL RECORD NO.:  [id number]          PATIENT TYPE:  INP   LOCATION:  9304                          FACILITY:  [facility]   PHYSICIAN:  Janine Limbo, M.D.DATE OF BIRTH:  Aug 17, 1976   DATE OF PROCEDURE:  08/21/2007  DATE OF DISCHARGE:                               OPERATIVE REPORT   PREOPERATIVE DIAGNOSIS:  1. 33 weeks and 6 day gestation.  2. Preterm labor.  3. Footling breech presentation.  4. Preterm rupture of membranes.  5. Obesity (height 5 feet 6 inches, weight 272 pounds).   POSTOPERATIVE DIAGNOSIS:  1. 33 weeks and 6 day gestation.  2. Preterm labor.  3. Footling breech presentation.  4. Preterm rupture of membranes.  5. Obesity (height 5 feet 6 inches, weight 272 pounds).   PROCEDURE:  Primary low transverse cesarean section.   SURGEON:  Janine Limbo, M.D.   FIRST ASSISTANT:  Rhona Leavens, CNM   ANESTHESIA:  Spinal.   DISPOSITION:  Gina Schneider is a 35 year old female, gravida 2, para 0-1-0-  1, who presented on July 23, 2007, with preterm labor.  She was known  to have a breech infant.  The patient has received betamethasone and she  has been on bedrest in the hospital.  On August 21, 2007, the patient  was noted to have some contractions.  Her nonstress test was reactive.  She was noted to have leakage of amniotic fluid.  Her cervix was 4-5 cm  dilated.  The patient understood the indications for her surgical  procedure and she accepted the risks of, but not limited to, anesthetic  complications, bleeding, infection, and possible damage to the  surrounding organs.   FINDINGS:  The weight of the infant is currently not known.  However, a  female was delivered from a footling breech presentation.  The Apgars  were 7 at 1 minute and 9 at 5 minutes.  The arterial cord blood pH was  7.26.  The fallopian tubes, the ovaries, and the uterus was normal for  the gravid state.   DESCRIPTION OF  PROCEDURE:  The patient was taken to the operating room  where a spinal anesthetic was given.  The patient's abdomen, perineum,  and vagina were prepped with multiple layers of Betadine.  A Foley  catheter was placed in the bladder.  The patient was sterilely draped.  The lower abdomen was injected with 10 mL of 0.5% Marcaine with  epinephrine.  A low transverse incision was made in the abdomen and  carried sharply through the subcutaneous tissue, the fascia, and the  anterior peritoneum.  The bladder flap was developed.  An incision was  made in the lower uterine segment and extended in a low transverse  fashion.  The infant was delivered from a double footling breech  presentation without difficulty.  The cord was clamped and cut.  The  infant was handed to the awaiting pediatric team.  Routine cord blood  studies were obtained.  The placenta was removed.  The uterine cavity  was cleaned of amniotic fluid, clotted blood, and  membranes.  The  uterine incision was closed using a running locking suture of 2-0 Vicryl  followed by an imbricating suture of 2-0 Vicryl.  Hemostasis was  adequate.  The pelvis was vigorously irrigated.  The anterior peritoneum  and abdominal musculature were reapproximated in the midline using 2-0  Vicryl.  The fascia was closed using a running suture of 0 Vicryl  followed by three interrupted sutures of 0 Vicryl.  A Jackson-Pratt  drain was placed in the subcutaneous layer and brought out through the  left lower quadrant.  It was sutured into place using 3-0 silk.  The  subcutaneous layer was closed using a running suture of 0 Vicryl.  The  skin was reapproximated using a subcuticular suture of 3-0 Monocryl.  Sponge, needle, and instrument counts were correct on two occasions.  Estimated blood loss for the procedure was 600 mL.  The patient  tolerated her procedure well.  There were no complications.  The patient  was taken to the recovery room in stable  condition.  The infant was  taken to the neonatal intensive care nursery for observation.  The  placenta was sent to pathology.      Janine Limbo, M.D.  Electronically Signed     AVS/MEDQ  D:  08/21/2007  T:  08/21/2007  Job:  [id number]

## 2011-07-04 LAB — CBC
HCT: 44 % (ref 36–46)
Hemoglobin: 14.1 g/dL (ref 12.0–16.0)

## 2011-07-04 LAB — ANTIBODY SCREEN: Antibody Screen: NEGATIVE

## 2011-07-16 ENCOUNTER — Inpatient Hospital Stay (INDEPENDENT_AMBULATORY_CARE_PROVIDER_SITE_OTHER)
Admission: RE | Admit: 2011-07-16 | Discharge: 2011-07-16 | Disposition: A | Discharge status: Home / Self Care | Payer: Managed Care, Other (non HMO) | Source: Ambulatory Visit | Attending: Family Medicine | Admitting: Family Medicine

## 2011-07-16 ENCOUNTER — Encounter: Payer: Self-pay | Admitting: Family Medicine

## 2011-07-16 DIAGNOSIS — R197 Diarrhea, unspecified: Secondary | ICD-10-CM

## 2011-07-16 DIAGNOSIS — IMO0002 Reserved for concepts with insufficient information to code with codable children: Secondary | ICD-10-CM

## 2011-07-16 DIAGNOSIS — Z331 Pregnant state, incidental: Secondary | ICD-10-CM

## 2011-07-23 ENCOUNTER — Other Ambulatory Visit: Payer: Self-pay | Admitting: Obstetrics and Gynecology

## 2011-07-30 ENCOUNTER — Other Ambulatory Visit: Payer: Self-pay | Admitting: Obstetrics and Gynecology

## 2011-07-31 MED ORDER — SODIUM CHLORIDE 0.9 % IJ SOLN: Freq: Once | INTRAMUSCULAR | Status: DC

## 2011-08-21 ENCOUNTER — Encounter (HOSPITAL_COMMUNITY): Payer: Self-pay | Admitting: *Deleted

## 2011-08-29 ENCOUNTER — Inpatient Hospital Stay (HOSPITAL_COMMUNITY)
Admission: AD | Admit: 2011-08-29 | Discharge: 2011-08-30 | Disposition: A | Discharge status: Home / Self Care | Payer: Managed Care, Other (non HMO) | Source: Ambulatory Visit | Attending: Obstetrics and Gynecology | Admitting: Obstetrics and Gynecology

## 2011-08-29 ENCOUNTER — Inpatient Hospital Stay (HOSPITAL_COMMUNITY): Payer: Managed Care, Other (non HMO)

## 2011-08-29 ENCOUNTER — Encounter (HOSPITAL_COMMUNITY): Payer: Self-pay | Admitting: Obstetrics and Gynecology

## 2011-08-29 DIAGNOSIS — O99891 Other specified diseases and conditions complicating pregnancy: Secondary | ICD-10-CM | POA: Insufficient documentation

## 2011-08-29 DIAGNOSIS — J329 Chronic sinusitis, unspecified: Secondary | ICD-10-CM | POA: Insufficient documentation

## 2011-08-29 DIAGNOSIS — J45901 Unspecified asthma with (acute) exacerbation: Secondary | ICD-10-CM | POA: Insufficient documentation

## 2011-08-29 LAB — URINALYSIS, ROUTINE W REFLEX MICROSCOPIC
Glucose, UA: NEGATIVE mg/dL
Protein, ur: NEGATIVE mg/dL
pH: 6 (ref 5.0–8.0)

## 2011-08-29 LAB — CBC
Platelets: 275 10*3/uL (ref 150–400)
RBC: 4.72 MIL/uL (ref 3.87–5.11)
WBC: 11.8 10*3/uL — ABNORMAL HIGH (ref 4.0–10.5)

## 2011-08-29 LAB — URINE MICROSCOPIC-ADD ON

## 2011-08-29 LAB — DIFFERENTIAL
Lymphocytes Relative: 21 % (ref 12–46)
Lymphs Abs: 2.4 10*3/uL (ref 0.7–4.0)
Neutrophils Relative %: 70 % (ref 43–77)

## 2011-08-29 MED ORDER — HYDROCOD POLST-CHLORPHEN POLST 10-8 MG/5ML PO LQCR
5.0000 mL | Freq: Two times a day (BID) | ORAL | Status: DC | PRN
  Administered 2011-08-30: 5 mL via ORAL
  Filled 2011-08-29: qty 5

## 2011-08-29 MED ORDER — LEVALBUTEROL HCL 1.25 MG/0.5ML IN NEBU
1.2500 mg | INHALATION_SOLUTION | Freq: Once | RESPIRATORY_TRACT | Status: AC
  Administered 2011-08-29: 1.25 mg via RESPIRATORY_TRACT
  Filled 2011-08-29: qty 0.5

## 2011-08-29 NOTE — Progress Notes (Deleted)
"  I was seen in the office earlier today.  The doctor said she would induce my labor on 09/02/11.  I was having UC's there, but now they are much stronger than before."

## 2011-08-29 NOTE — Progress Notes (Signed)
Pt states," I have had seasonal allergies for 2 wks, but my chest feels tight with a stabbing pain that goes from the front all the way to the back. I've been coughing up yellow phelm since yesterday. 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

## 2011-08-29 NOTE — Progress Notes (Signed)
"  I am worried about my baby because I have had 3 asthma attacks today.  I started feeling really bad Thursday and was unable to work.  I also did not go to work today either."

## 2011-08-30 MED ORDER — HYDROCOD POLST-CHLORPHEN POLST 10-8 MG/5ML PO LQCR: 5.0000 mL | Freq: Two times a day (BID) | ORAL | Status: DC | PRN

## 2011-08-30 MED ORDER — LEVALBUTEROL HCL 1.25 MG/3ML IN NEBU: 1.0000 | INHALATION_SOLUTION | Freq: Four times a day (QID) | RESPIRATORY_TRACT | Status: DC | PRN

## 2011-08-30 MED ORDER — AZITHROMYCIN 250 MG PO TABS: ORAL_TABLET | ORAL | Status: DC

## 2011-08-30 NOTE — ED Provider Notes (Signed)
History   Gina Schneider is a 35 y.o. Obese white female [id number] at 13.2 weeks per [facility] 03/03/12 who presents with CC of worsening respiratory distress r/t allergies, asthma, & sinuses.  Pt reports exacerbation of her seasonal allergies over the last 2 weeks--has been taking antihistamine.  Current symptoms worse over the last 2 days; today concerned b/c she has used her rescue inhaler 4-5 times, and chest still feels tight, like someone standing on it; also reports stabbing feeling from front all the way to back of chest, and "raw" inside.  Has lost her voice in past 2 days.  Productive cough w/ "yellow" phlegm last few days.  Saw Dr. Su Hilt Monday 08/25/11 as symptoms starting to compound to sore throat, congestion, and reports her ears, nose, & throat looked "clear."  Pt Rx'd Qvar bid by Dr. Normand Sloop her OB primary.  Pt's cough so intense, that it has caused her to gag trying to get expectorant up, and also causing frequent urinary incontinence.  Accompanied to MAU by a female friend; her husband stayed home with their kids.  Pt missed work the past 2 days.  She denies fever, chills, but has felt lower abdominal cramping and fatigue.  No VB, LOF, or other UTI s/s.  No known exposure to person's with similar or other illnesses and reports her family is well.  Pt is scheduled for cerclage placement 09/10/11.  She does not have a PCP or someone who follows her asthma or allergies.  Anxious r/e Fetal well-being.   Pregnancy r/f:  1. AMA  2.  Obese  3.  Asthma  4.  Prev c/s x1  5.  Previous PTD x2  6.  H/o dynamic cx and will have cerclage placed 09/10/11 and plans 17-p shots this pregnancy  7.  H/o SABX2 (twin preg with one of the SABS)  8.  Anxiety  9.  Breast augmentation '04  10.  Sinus sx '07  11.  H/o cryo '04  12.  Migraines  13.  GERD--on protonix  14.  Latex allergic  15.  H/o pyelo and in past freq UTI's  Chief Complaint  Patient presents with  . Respiratory Distress   HPI  OB History    Grav Para  Term Preterm Abortions TAB SAB Ect Mult Living   4 2 0 2 1  1  1 2       Past Medical History  Diagnosis Date  . PONV (postoperative nausea and vomiting)   . Asthma     allergy induced asthma  . Reflux   . Headache     otc med - prn  . Anxiety     hx - no meds    Past Surgical History  Procedure Date  . Lympth node     left - armpit  . Bone spurs     left and right feet  . Ankle surgery     left ankle infection - exploratory surg  . Breast enhancement surgery   . Replacement breast augmentation      x 2  . Nasal sinus surgery   . Cesarean section   . Dilation and curettage of uterus     No family history on file.  History  Substance Use Topics  . Smoking status: Never Smoker   . Smokeless tobacco: Never Used  . Alcohol Use: Yes     occasionally but none with pregnancy    Allergies:  Allergies  Allergen Reactions  . Ceclor (Cefaclor) Rash  Prescriptions prior to admission  Medication Sig Dispense Refill  . acetaminophen (TYLENOL) 500 MG tablet Take 1,000 mg by mouth as needed. Pain        . albuterol (PROVENTIL HFA;VENTOLIN HFA) 108 (90 BASE) MCG/ACT inhaler Inhale 2-3 puffs into the lungs as needed. For asthma       . beclomethasone (QVAR) 40 MCG/ACT inhaler Inhale 2 puffs into the lungs every morning.        . ondansetron (ZOFRAN-ODT) 4 MG disintegrating tablet Take 4 mg by mouth every 8 (eight) hours as needed. For nausea and vomiting      . OVER THE COUNTER MEDICATION Take 2 tablets by mouth daily. Patient takes vitafusion prenatal gummies       . pantoprazole (PROTONIX) 20 MG tablet Take 20 mg by mouth daily.        . pseudoephedrine (SUDAFED) 30 MG tablet Take 60 mg by mouth as needed. Congestion          Review of Systems  Constitutional: Negative.   HENT: Positive for congestion and sore throat.        "laryngitis"  Eyes: Negative.   Respiratory: Positive for cough, sputum production and shortness of breath.   Cardiovascular: Negative.     Gastrointestinal: Positive for heartburn, nausea and abdominal pain.  Genitourinary: Negative.   Musculoskeletal: Negative.   Skin: Negative.   Neurological: Positive for headaches. Negative for weakness.   Physical Exam   Blood pressure 128/80, pulse 96, temperature 99 F (37.2 C), resp. rate 20, height 5\' 6"  (1.676 m), weight 121.11 kg (267 lb), SpO2 97.00%.  Physical Exam  Constitutional: She is oriented to person, place, and time. She appears well-developed and well-nourished. She appears distressed.       Moderately distressed with frequent coughing spells every few minutes  HENT:       Hoarse and voice almost inaudible secondary to laryngitis  Cardiovascular: Normal rate and regular rhythm.   Respiratory: She is in respiratory distress. She has wheezes. She has no rales. She exhibits no tenderness.  GI: Soft. Bowel sounds are normal. There is no tenderness.       Holds pannus when she coughs  Genitourinary:       deferred  Musculoskeletal: Normal range of motion.  Neurological: She is alert and oriented to person, place, and time.  Skin: Skin is warm and dry.    MAU Course  Procedures 1. CBC w/ diff WNL x WBC slightly elevated at 11.8; diff neg x Abs Neutrophils=8.3 (H) 2. U/a WNL x tr leuks 3.  CXRY:  No cardiopulmonary dz (completed after nebulizer treatment) 4.  Nebulizer tx x1 w/ Xopenex w/ good relief 5.  Limited OB u/s for FHT's (unable to hear on abdomen secondary to habitus)--AFI subjectively WNL & FHR=167  6. Continuous pulse ox--difficult secondary to pt getting up to BR frequently Assessment and Plan  1.  IUP at 13.2 2.  Respiratory distress/asthma exacerbation r/t seasonal allergies/sinusitis 3.  Slightly elevated WBC, but no shift in diff 4. Nml CXRY 5.  Moderate symptom improvement after nebulizer trx & po Tussionex  1.  Per c/w dr. haygood--d/c'd home and the  Rx's called into [facility] [address]:  Zpak(pt requested), Tussionex, Qvar refills, Albuterol MDI  refills, Xopenex neb solution (use bid, up to 4x per day x 2 days); 2.  Continue Protonix, Zyrtec; Tylenol prn, Sudafed prn; INCREASE FLUID INTAKE; netipot enc'd 3. Proper hygeine 4.  Referral to Dr. Colonel Bald per dr. Pennie Rushing at  1st of week to follow pt's allergies & asthma 5.  F/u prn 6.  Possibly consider steroid taper if above treatment regimen not improving condition  Shernita Rabinovich H 08/30/2011, 2:08 AM

## 2011-09-04 LAB — CBC
HCT: 38.3
MCHC: 33.8
MCV: 82.4
Platelets: 249
Platelets: 264
RDW: 14.7 — ABNORMAL HIGH
WBC: 12.3 — ABNORMAL HIGH

## 2011-09-04 LAB — SAMPLE TO BLOOD BANK

## 2011-09-05 LAB — DIFFERENTIAL
Basophils Absolute: 0
Basophils Relative: 0
Eosinophils Absolute: 0.2
Eosinophils Relative: 2
Monocytes Absolute: 1 — ABNORMAL HIGH
Monocytes Relative: 8
Neutro Abs: 9.3 — ABNORMAL HIGH

## 2011-09-05 LAB — COMPREHENSIVE METABOLIC PANEL
ALT: 12
Albumin: 2.8 — ABNORMAL LOW
Alkaline Phosphatase: 117
BUN: 4 — ABNORMAL LOW
Chloride: 105
Potassium: 4.1
Sodium: 135
Total Bilirubin: 0.3
Total Protein: 6.4

## 2011-09-05 LAB — CBC
HCT: 38.6
Hemoglobin: 13
Platelets: 301
RDW: 13.7
WBC: 12.8 — ABNORMAL HIGH

## 2011-09-05 LAB — URINALYSIS, ROUTINE W REFLEX MICROSCOPIC
Bilirubin Urine: NEGATIVE
Hgb urine dipstick: NEGATIVE
Ketones, ur: NEGATIVE
Nitrite: NEGATIVE
Protein, ur: NEGATIVE
Specific Gravity, Urine: 1.015
Urobilinogen, UA: 0.2

## 2011-09-05 LAB — MAGNESIUM: Magnesium: 3.5 — ABNORMAL HIGH

## 2011-09-05 LAB — CARDIAC PANEL(CRET KIN+CKTOT+MB+TROPI)
CK, MB: 1.7
Total CK: 39
Troponin I: 0.02

## 2011-09-05 LAB — URINE CULTURE: Colony Count: 40000

## 2011-09-05 LAB — FETAL FIBRONECTIN: Fetal Fibronectin: POSITIVE

## 2011-09-08 LAB — URINALYSIS, ROUTINE W REFLEX MICROSCOPIC
Bilirubin Urine: NEGATIVE
Glucose, UA: NEGATIVE
Glucose, UA: NEGATIVE
Hgb urine dipstick: NEGATIVE
Hgb urine dipstick: NEGATIVE
Ketones, ur: 15 — AB
Protein, ur: NEGATIVE
Urobilinogen, UA: 0.2
pH: 6.5

## 2011-09-09 ENCOUNTER — Other Ambulatory Visit: Payer: Self-pay | Admitting: Obstetrics and Gynecology

## 2011-09-09 NOTE — H&P (Signed)
GLORIOUS FLICKER is an 35 y.o. female. Pt presents for cerclage.  Pt has hx of cryo on her cervix and PTL  Pertinent Gynecological History: Menses: regular every 28 days without intermenstrual spotting Bleeding: none Contraception: none DES exposure: denies Blood transfusions: none Sexually transmitted diseases: no past history Previous GYN Procedures: cryo  Last mammogram: na Date: na Last pap: normal Date: 04/30/11 OB History: G5, P0222   Menstrual History: Menarche age: 42 No LMP recorded. Patient is pregnant.    Past Medical History  Diagnosis Date  . PONV (postoperative nausea and vomiting)   . Asthma     allergy induced asthma  . Reflux   . Headache     otc med - prn  . Anxiety     hx - no meds    Past Surgical History  Procedure Date  . Lympth node     left - armpit  . Bone spurs     left and right feet  . Ankle surgery     left ankle infection - exploratory surg  . Breast enhancement surgery   . Replacement breast augmentation      x 2  . Nasal sinus surgery   . Cesarean section   . Dilation and curettage of uterus     No family history on file.  Social History:  reports that she has never smoked. She has never used smokeless tobacco. She reports that she drinks alcohol. She reports that she does not use illicit drugs.  Allergies:  Allergies  Allergen Reactions  . Ceclor (Cefaclor) Rash     (Not in a hospital admission)  ROS  There were no vitals taken for this visit. Physical Exam BP 110/70 weight 267 Gen PT in NAD  cv RRR Lungs CTAb Abd nd soft nt VV nml mucosa no masss cx  Long and closed Uterus mobile 10 week sixze 1 No results found for this or any previous visit (from the past 24 hour(s)).  No results found.  Assessment/Plan: 14weeks ho of cryo and PTD twice Pt desires cerclage and 17p Pt understands the risks are bleeding , infection and PPROm WHICH COULD LEAD to fetal loss.  Pt understands that PTL and incompetent cervix may  be a contiuum of one another and this is why she desires cerclage.     Viet Kemmerer A 09/09/2011, 3:14 PM

## 2011-09-10 ENCOUNTER — Encounter (HOSPITAL_COMMUNITY): Payer: Self-pay | Admitting: *Deleted

## 2011-09-10 ENCOUNTER — Encounter (HOSPITAL_COMMUNITY): Payer: Self-pay | Admitting: Anesthesiology

## 2011-09-10 ENCOUNTER — Encounter (HOSPITAL_COMMUNITY)
Admission: RE | Disposition: A | Discharge status: Home / Self Care | Payer: Self-pay | Source: Ambulatory Visit | Attending: Obstetrics and Gynecology

## 2011-09-10 ENCOUNTER — Ambulatory Visit (HOSPITAL_COMMUNITY)
Admission: RE | Admit: 2011-09-10 | Discharge: 2011-09-10 | Disposition: A | Discharge status: Home / Self Care | Payer: Managed Care, Other (non HMO) | Source: Ambulatory Visit | Attending: Obstetrics and Gynecology | Admitting: Obstetrics and Gynecology

## 2011-09-10 ENCOUNTER — Ambulatory Visit (HOSPITAL_COMMUNITY): Payer: Managed Care, Other (non HMO) | Admitting: Anesthesiology

## 2011-09-10 DIAGNOSIS — O3430 Maternal care for cervical incompetence, unspecified trimester: Secondary | ICD-10-CM | POA: Insufficient documentation

## 2011-09-10 HISTORY — DX: Headache: R51

## 2011-09-10 HISTORY — DX: Anxiety disorder, unspecified: F41.9

## 2011-09-10 HISTORY — DX: Other specified postprocedural states: Z98.890

## 2011-09-10 HISTORY — DX: Reserved for inherently not codable concepts without codable children: IMO0001

## 2011-09-10 HISTORY — DX: Gastro-esophageal reflux disease without esophagitis: K21.9

## 2011-09-10 HISTORY — DX: Other specified postprocedural states: R11.2

## 2011-09-10 HISTORY — PX: CERVICAL CERCLAGE: SHX1329

## 2011-09-10 LAB — CBC
HCT: 41.1 % (ref 36.0–46.0)
HCT: 38.6
Hemoglobin: 13.8 g/dL (ref 12.0–15.0)
Hemoglobin: 12.8
MCHC: 33.6 g/dL (ref 30.0–36.0)
MCV: 85.4 fL (ref 78.0–100.0)
Platelets: 281
RDW: 13.2
WBC: 11.3 10*3/uL — ABNORMAL HIGH (ref 4.0–10.5)
WBC: 12.2 — ABNORMAL HIGH

## 2011-09-10 LAB — URINALYSIS, ROUTINE W REFLEX MICROSCOPIC
Glucose, UA: NEGATIVE
Ketones, ur: NEGATIVE
Nitrite: NEGATIVE
Specific Gravity, Urine: 1.01
pH: 7

## 2011-09-10 SURGERY — CERCLAGE, CERVIX, VAGINAL APPROACH
Anesthesia: Spinal | Site: Vagina | Wound class: Clean Contaminated

## 2011-09-10 MED ORDER — PANTOPRAZOLE SODIUM 40 MG PO TBEC
DELAYED_RELEASE_TABLET | ORAL | Status: AC
  Filled 2011-09-10: qty 1

## 2011-09-10 MED ORDER — INDOMETHACIN 50 MG PO CAPS: 25.0000 mg | ORAL_CAPSULE | Freq: Four times a day (QID) | ORAL | Status: DC

## 2011-09-10 MED ORDER — INDOMETHACIN 50 MG PO CAPS
50.0000 mg | ORAL_CAPSULE | Freq: Once | ORAL | Status: AC
  Administered 2011-09-10: 50 mg via ORAL
  Filled 2011-09-10: qty 1

## 2011-09-10 MED ORDER — PHENYLEPHRINE HCL 10 MG/ML IJ SOLN
INTRAMUSCULAR | Status: DC | PRN
  Administered 2011-09-10: 40 ug via INTRAVENOUS

## 2011-09-10 MED ORDER — CLINDAMYCIN PHOSPHATE 600 MG/50ML IV SOLN
600.0000 mg | Freq: Once | INTRAVENOUS | Status: AC
  Administered 2011-09-10: 600 mg via INTRAVENOUS
  Filled 2011-09-10: qty 50

## 2011-09-10 MED ORDER — BUPIVACAINE IN DEXTROSE 0.75-8.25 % IT SOLN
INTRATHECAL | Status: DC | PRN
  Administered 2011-09-10: 10 mg via INTRATHECAL

## 2011-09-10 MED ORDER — PHENYLEPHRINE 40 MCG/ML (10ML) SYRINGE FOR IV PUSH (FOR BLOOD PRESSURE SUPPORT)
PREFILLED_SYRINGE | INTRAVENOUS | Status: AC
  Filled 2011-09-10: qty 5

## 2011-09-10 MED ORDER — LACTATED RINGERS IV SOLN
INTRAVENOUS | Status: DC
  Administered 2011-09-10 (×2): via INTRAVENOUS

## 2011-09-10 MED ORDER — PANTOPRAZOLE SODIUM 40 MG PO TBEC
40.0000 mg | DELAYED_RELEASE_TABLET | Freq: Once | ORAL | Status: AC
  Administered 2011-09-10: 40 mg via ORAL

## 2011-09-10 MED ORDER — LACTATED RINGERS IV SOLN: INTRAVENOUS | Status: DC | PRN

## 2011-09-10 MED ORDER — CLINDAMYCIN PHOSPHATE 600 MG/50ML IV SOLN: 600.0000 mg | Freq: Once | INTRAVENOUS | Status: DC

## 2011-09-10 MED ORDER — ONDANSETRON HCL 4 MG/2ML IJ SOLN
INTRAMUSCULAR | Status: DC | PRN
  Administered 2011-09-10: 4 mg via INTRAVENOUS

## 2011-09-10 MED ORDER — INDOMETHACIN 75 MG PO CPCR: ORAL_CAPSULE | ORAL | Status: DC

## 2011-09-10 MED ORDER — ONDANSETRON HCL 4 MG/2ML IJ SOLN
INTRAMUSCULAR | Status: AC
  Filled 2011-09-10: qty 2

## 2011-09-10 MED ORDER — LEVALBUTEROL HCL 1.25 MG/0.5ML IN NEBU
1.2500 mg | INHALATION_SOLUTION | Freq: Four times a day (QID) | RESPIRATORY_TRACT | Status: DC | PRN
  Administered 2011-09-10: 1.25 mg via RESPIRATORY_TRACT
  Filled 2011-09-10: qty 0.5

## 2011-09-10 SURGICAL SUPPLY — 20 items
CATH ROBINSON RED A/P 16FR (CATHETERS) ×2 IMPLANT
CLOTH BEACON ORANGE TIMEOUT ST (SAFETY) ×2 IMPLANT
COUNTER NEEDLE 1200 MAGNETIC (NEEDLE) IMPLANT
GLOVE BIO SURGEON STRL SZ 6.5 (GLOVE) ×4 IMPLANT
GLOVE BIOGEL PI IND STRL 7.0 (GLOVE) ×1 IMPLANT
GLOVE BIOGEL PI INDICATOR 7.0 (GLOVE) ×1
GOWN PREVENTION PLUS LG XLONG (DISPOSABLE) ×4 IMPLANT
NEEDLE MAYO .5 CIRCLE (NEEDLE) ×2 IMPLANT
NEEDLE SPNL 22GX3.5 QUINCKE BK (NEEDLE) ×2 IMPLANT
PACK VAGINAL MINOR WOMEN LF (CUSTOM PROCEDURE TRAY) ×2 IMPLANT
PAD PREP 24X48 CUFFED NSTRL (MISCELLANEOUS) ×2 IMPLANT
SCOPETTES 8  STERILE (MISCELLANEOUS) ×1
SCOPETTES 8 STERILE (MISCELLANEOUS) ×1 IMPLANT
SUT MERSILENE 5MM BP 1 12 (SUTURE) ×4 IMPLANT
SYR CONTROL 10ML LL (SYRINGE) IMPLANT
TOWEL OR 17X24 6PK STRL BLUE (TOWEL DISPOSABLE) ×4 IMPLANT
TRAY FOLEY CATH 14FR (SET/KITS/TRAYS/PACK) ×2 IMPLANT
TUBING NON-CON 1/4 X 20 CONN (TUBING) IMPLANT
WATER STERILE IRR 1000ML POUR (IV SOLUTION) IMPLANT
YANKAUER SUCT BULB TIP NO VENT (SUCTIONS) IMPLANT

## 2011-09-10 NOTE — Transfer of Care (Signed)
Immediate Anesthesia Transfer of Care Note  Patient: Gina Schneider  Procedure(s) Performed:  CERCLAGE CERVICAL  Patient Location: PACU  Anesthesia Type: Spinal  Level of Consciousness: awake, alert  and oriented  Airway & Oxygen Therapy: Patient Spontanous Breathing  Post-op Assessment: Report given to PACU RN and Post -op Vital signs reviewed and stable  Post vital signs: stable  Complications: No apparent anesthesia complications

## 2011-09-10 NOTE — Anesthesia Postprocedure Evaluation (Signed)
Anesthesia Post Note  Patient: Gina Schneider  Procedure(s) Performed:  CERCLAGE CERVICAL  Anesthesia type: Spinal  Patient location: PACU  Post pain: Pain level controlled  Post assessment: Post-op Vital signs reviewed  Last Vitals:  Filed Vitals:   09/10/11 0838  BP: 123/72  Pulse: 81  Temp: 98.1 F (36.7 C)  Resp: 16    Post vital signs: Reviewed  Level of consciousness: awake  Complications: No apparent anesthesia complications

## 2011-09-10 NOTE — Op Note (Signed)
Preoperative: Dynamic cervix history of preterm delivery Postoperative diagnosis: same Procedure in a McDonald cerclage Surgeon Dr. Normand Sloop Anesthesia: Spinal Patient: none Estimated blood loss: Minimal Urine output: 50 cc and the procedure Procedure in detail The patient was taken to the operating room. Prepped and draped in a normal sterile fashion. A Foley catheter was placed into the bladder. The anterior posterior lip of the cervix was grasped with ring forceps. The patients vaginal inlet was too small to accommodate the large needle on the Mersilene stitch.  The needle was cut off at the Mersilene stitch and the needle was then attached to mesriline.  A McDonald cerclage was placed without difficulty. There is minimal bleeding noted. Cervix was noted to be long and closed before and after the cerclage is placed  A long discussion with them of the patient before the procedure took place.  This discussion was done in the office. The patient was told they she's had 2 preterm deliveries and a history of a cryo on her cervix.  During her pregnancies she has been on bed rest with  Dynamic changes and shortening of the  cervix. Her children have been born preterm. Was discussed with the patient that preterm labor can bea continuum of ain competent cervix.  For this reason the patient was offered  cerclage and 17 P. The risk and benefits of both were discussed with the patient in detail. The patient went to proceed the cerclage.

## 2011-09-10 NOTE — Anesthesia Procedure Notes (Signed)
Spinal Block  Patient location during procedure: OR Start time: 09/10/2011 10:13 AM Staffing Performed by: anesthesiologist  Preanesthetic Checklist Completed: patient identified, site marked, surgical consent, pre-op evaluation, timeout performed, IV checked, risks and benefits discussed and monitors and equipment checked Spinal Block Patient position: sitting Prep: DuraPrep Patient monitoring: heart rate, cardiac monitor, continuous pulse ox and blood pressure Approach: midline Location: L3-4 Injection technique: single-shot Needle Needle type: Sprotte  Needle gauge: 24 G Needle length: 9 cm Assessment Sensory level: T4 Additional Notes Patient identified.  Risk benefits discussed including failed block, incomplete pain control, headache, nerve damage, paralysis, blood pressure changes, nausea, vomiting, reactions to medication both toxic or allergic, and postpartum back pain.  Patient expressed understanding and wished to proceed.  All questions were answered.  Sterile technique used throughout procedure.  CSF was clear.  No parasthesia or other complications.  Please see nursing notes for vital signs.

## 2011-09-10 NOTE — Anesthesia Preprocedure Evaluation (Signed)
Anesthesia Evaluation  Name, MR# and DOB Patient awake  General Assessment Comment  Reviewed: Allergy & Precautions, H&P , Patient's Chart, lab work & pertinent test results  History of Anesthesia Complications (+) PONV  Airway Mallampati: III TM Distance: >3 FB Neck ROM: full    Dental No notable dental hx.    Pulmonary asthma  clear to auscultation  Pulmonary exam normal + wheezing      Cardiovascular regular Normal    Neuro/Psych  Headaches, PSYCHIATRIC DISORDERS Negative Neurological ROS  Negative Psych ROS   GI/Hepatic negative GI ROS Neg liver ROS    Endo/Other  Negative Endocrine ROSMorbid obesity  Renal/GU negative Renal ROS     Musculoskeletal   Abdominal   Peds  Hematology negative hematology ROS (+)   Anesthesia Other Findings   Reproductive/Obstetrics (+) Pregnancy                           Anesthesia Physical Anesthesia Plan  ASA: III  Anesthesia Plan: Spinal   Post-op Pain Management:    Induction:   Airway Management Planned:   Additional Equipment:   Intra-op Plan:   Post-operative Plan:   Informed Consent: I have reviewed the patients History and Physical, chart, labs and discussed the procedure including the risks, benefits and alternatives for the proposed anesthesia with the patient or authorized representative who has indicated his/her understanding and acceptance.     Plan Discussed with:   Anesthesia Plan Comments:         Anesthesia Quick Evaluation

## 2011-09-10 NOTE — H&P (Signed)
H& P update Pt without complaint.  Will proceed.   Fam HX Non contributory ROS Psych  Sig for anxiety MS no weakness Heme  No bleeding disorders GU Pregnant

## 2011-09-12 ENCOUNTER — Encounter (HOSPITAL_COMMUNITY): Payer: Self-pay | Admitting: Obstetrics and Gynecology

## 2011-09-25 ENCOUNTER — Encounter (HOSPITAL_COMMUNITY): Payer: Self-pay | Admitting: *Deleted

## 2011-09-25 ENCOUNTER — Observation Stay (HOSPITAL_COMMUNITY)
Admission: AD | Admit: 2011-09-25 | Discharge: 2011-09-26 | DRG: 782 | Disposition: A | Discharge status: Home / Self Care | Payer: Managed Care, Other (non HMO) | Source: Ambulatory Visit | Attending: Obstetrics and Gynecology | Admitting: Obstetrics and Gynecology

## 2011-09-25 DIAGNOSIS — O2690 Pregnancy related conditions, unspecified, unspecified trimester: Secondary | ICD-10-CM

## 2011-09-25 DIAGNOSIS — O3430 Maternal care for cervical incompetence, unspecified trimester: Principal | ICD-10-CM | POA: Diagnosis present

## 2011-09-25 DIAGNOSIS — Z3689 Encounter for other specified antenatal screening: Secondary | ICD-10-CM

## 2011-09-25 LAB — WET PREP, GENITAL
Trich, Wet Prep: NONE SEEN
Yeast Wet Prep HPF POC: NONE SEEN

## 2011-09-25 MED ORDER — DOCUSATE SODIUM 100 MG PO CAPS
100.0000 mg | ORAL_CAPSULE | Freq: Every day | ORAL | Status: DC
  Administered 2011-09-26: 100 mg via ORAL
  Filled 2011-09-25: qty 1

## 2011-09-25 MED ORDER — FLUTICASONE PROPIONATE HFA 44 MCG/ACT IN AERO
2.0000 | INHALATION_SPRAY | Freq: Two times a day (BID) | RESPIRATORY_TRACT | Status: DC | PRN
  Administered 2011-09-25 – 2011-09-26 (×2): 2 via RESPIRATORY_TRACT
  Filled 2011-09-25: qty 10.6

## 2011-09-25 MED ORDER — PRENATAL PLUS 27-1 MG PO TABS
1.0000 | ORAL_TABLET | Freq: Every day | ORAL | Status: DC
  Administered 2011-09-26: 1 via ORAL
  Filled 2011-09-25: qty 1

## 2011-09-25 MED ORDER — CALCIUM CARBONATE ANTACID 500 MG PO CHEW: 1.0000 | CHEWABLE_TABLET | Freq: Three times a day (TID) | ORAL | Status: DC | PRN

## 2011-09-25 MED ORDER — LEVALBUTEROL TARTRATE 45 MCG/ACT IN AERO
2.0000 | INHALATION_SPRAY | Freq: Three times a day (TID) | RESPIRATORY_TRACT | Status: DC | PRN
  Administered 2011-09-26 (×2): 2 via RESPIRATORY_TRACT
  Filled 2011-09-25: qty 15

## 2011-09-25 MED ORDER — PANTOPRAZOLE SODIUM 40 MG PO TBEC
40.0000 mg | DELAYED_RELEASE_TABLET | Freq: Two times a day (BID) | ORAL | Status: DC | PRN
  Administered 2011-09-25 – 2011-09-26 (×2): 40 mg via ORAL
  Filled 2011-09-25 (×3): qty 1

## 2011-09-25 MED ORDER — ACETAMINOPHEN 325 MG PO TABS
650.0000 mg | ORAL_TABLET | ORAL | Status: DC | PRN
  Administered 2011-09-25: 650 mg via ORAL
  Filled 2011-09-25: qty 2

## 2011-09-25 MED ORDER — ALBUTEROL SULFATE HFA 108 (90 BASE) MCG/ACT IN AERS: 2.0000 | INHALATION_SPRAY | RESPIRATORY_TRACT | Status: DC | PRN

## 2011-09-25 MED ORDER — LORATADINE 10 MG PO TABS
10.0000 mg | ORAL_TABLET | Freq: Every day | ORAL | Status: DC | PRN
  Administered 2011-09-25: 10 mg via ORAL
  Filled 2011-09-25 (×2): qty 1

## 2011-09-25 MED ORDER — ZOLPIDEM TARTRATE 10 MG PO TABS
10.0000 mg | ORAL_TABLET | Freq: Every evening | ORAL | Status: DC | PRN
  Administered 2011-09-25: 10 mg via ORAL
  Filled 2011-09-25: qty 1

## 2011-09-25 MED ORDER — LORATADINE 10 MG PO TABS: 10.0000 mg | ORAL_TABLET | Freq: Every day | ORAL | Status: DC

## 2011-09-25 MED ORDER — CALCIUM CARBONATE ANTACID 500 MG PO CHEW
2.0000 | CHEWABLE_TABLET | ORAL | Status: DC | PRN
  Administered 2011-09-25: 400 mg via ORAL
  Filled 2011-09-25: qty 2

## 2011-09-25 NOTE — H&P (Signed)
Gina Schneider is a 35 y.o. female presenting for progressive cervical shortening.  Pt with previous dx of incompetent cervix and s/p cerclage placement.  Cervical length decreased from 4.8cm last week to 0.87cm today.  Denies ROM or bldg.  No cramping at the present time.  Pt had already been tried on 17-P but had allergic reaction to the medication.   History Hx present pregnancy:  Pt followed by MD service at [facility].  Pt entered care [redacted]wks gestation.  EDC established by Ultrasound performed at [redacted]wks gestation.  Pt underwent cerclage placement as noted below.   OB History    Grav Para Term Preterm Abortions TAB SAB Ect Mult Living   5 2 0 2 2  2  1 2      Past Medical History  Diagnosis Date  . PONV (postoperative nausea and vomiting)   . Asthma     allergy induced asthma  . Reflux   . Headache     otc med - prn  . Anxiety     hx - no meds   Past Surgical History  Procedure Date  . Lympth node     left - armpit  . Bone spurs     left and right feet  . Ankle surgery     left ankle infection - exploratory surg  . Breast enhancement surgery   . Replacement breast augmentation      x 2  . Nasal sinus surgery   . Cesarean section   . Dilation and curettage of uterus   . Cervical cerclage 09/10/2011    Procedure: CERCLAGE CERVICAL;  Surgeon: Michael Litter, MD;  Location: [facility];  Service: Gynecology;  Laterality: N/A;   Family History: family history includes Depression in her mother and sister; Diabetes in her mother and paternal grandmother; Heart disease in her maternal grandfather and paternal grandfather; Hypertension in her paternal grandfather; and Stroke in her paternal grandmother. Social History:  reports that she has never smoked. She has never used smokeless tobacco. She reports that she drinks alcohol. She reports that she does not use illicit drugs.  Review of Systems  Constitutional: Negative.   HENT: Negative.   Eyes: Negative.   Respiratory: Negative.     Cardiovascular: Negative.   Gastrointestinal: Negative.   Genitourinary: Negative.   Musculoskeletal: Negative.   Skin: Negative.   Neurological: Negative.   Endo/Heme/Allergies: Negative.   Psychiatric/Behavioral: Negative.       Blood pressure 114/50, pulse 105, temperature 98.4 F (36.9 C), temperature source Oral, resp. rate 18, height 5\' 8"  (1.727 m), weight 123.378 kg (272 lb), unknown if currently breastfeeding.   Fetal Exam Fetal Monitor Review: Mode: hand-held doppler probe.   Baseline rate: 150.      Physical Exam  Constitutional: She is oriented to person, place, and time. She appears well-developed and well-nourished.  HENT:  Head: Normocephalic and atraumatic.  Right Ear: External ear normal.  Left Ear: External ear normal.  Nose: Nose normal.  Eyes: Conjunctivae are normal. Pupils are equal, round, and reactive to light.  Neck: Normal range of motion. Neck supple. No thyromegaly present.  Cardiovascular: Normal rate, regular rhythm and intact distal pulses.   Respiratory: Effort normal and breath sounds normal.  GI: Soft. Bowel sounds are normal.  Genitourinary:       Deferred  Musculoskeletal: Normal range of motion.  Neurological: She is alert and oriented to person, place, and time. She has normal reflexes.  Skin: Skin is warm and dry.  Psychiatric: She has a normal mood and affect. Her behavior is normal. Judgment and thought content normal.    Results for orders placed during the hospital encounter of 09/25/11 (from the past 24 hour(s))  WET PREP, GENITAL     Status: Normal   Collection Time   09/25/11  2:55 PM      Component Value Range   Yeast, Wet Prep NONE SEEN  NONE SEEN    Trich, Wet Prep NONE SEEN  NONE SEEN    Clue Cells, Wet Prep NONE SEEN  NONE SEEN    WBC, Wet Prep HPF POC NONE SEEN  NONE SEEN    Prenatal labs: ABO, Rh: --/--/O POS (04/27 1202) Antibody: Negative (08/10 0000) Rubella: Immune (08/10 0000) RPR: Nonreactive (08/10  0000)  HBsAg: Negative (08/10 0000)  HIV: Non-reactive (08/10 0000)  GBS:   pending  Assessment/Plan: IUP at  17w 1d Cervical insufficiency S/P cerclage  Admit to Antepartum per Dr. Su Hilt. Will place in trendelenburg position and maintain strict bedrest. Obtain MFM consult.   Lem Peary O. 09/25/2011, 8:52 PM

## 2011-09-26 ENCOUNTER — Inpatient Hospital Stay (HOSPITAL_COMMUNITY): Payer: Managed Care, Other (non HMO)

## 2011-09-26 ENCOUNTER — Encounter (HOSPITAL_COMMUNITY): Payer: Managed Care, Other (non HMO)

## 2011-09-26 MED ORDER — PANTOPRAZOLE SODIUM 40 MG PO TBEC
40.0000 mg | DELAYED_RELEASE_TABLET | Freq: Every day | ORAL | Status: DC
  Filled 2011-09-26 (×2): qty 1

## 2011-09-26 MED ORDER — PANTOPRAZOLE SODIUM 20 MG PO TBEC: 20.0000 mg | DELAYED_RELEASE_TABLET | Freq: Every day | ORAL | Status: DC

## 2011-09-26 MED ORDER — LEVALBUTEROL TARTRATE 45 MCG/ACT IN AERO: 2.0000 | INHALATION_SPRAY | Freq: Three times a day (TID) | RESPIRATORY_TRACT | Status: DC | PRN

## 2011-09-26 MED ORDER — PROGESTERONE MICRONIZED 200 MG PO CAPS: 200.0000 mg | ORAL_CAPSULE | Freq: Every day | ORAL | Status: DC

## 2011-09-26 NOTE — Consult Note (Signed)
Respiratory Care Note:  Late Entry  Called to visit Mrs. Piccione for a Chemical engineer.  Patient was lying supine in no apparent distress.  Breath sounds were equal and clear.  No wheezes were present.  Patient has seasonal allergies/asthma.  She takes Xopenex HHN's at home as needed and Proventil MDI's 2 puffs Q6prn.  She also uses a QVAR MDI 2 puffs QAM.  My recommendation is for her to use Xopenex MDI 2 puffs Q6prn as needed and continue the QVAR MDI 2 puffs QAM.  She can still use her HHN's with Xopenex when needed as well.  Please let me know if I can be of any further assistance.  Thank you.  Hector Brunswick, RRT-NPS

## 2011-09-26 NOTE — Progress Notes (Signed)
The patient reports that she feels better. She denies contractions.  BP 136/71  Pulse 117  Temp(Src) 98.2 F (36.8 C) (Oral)  Resp 20  Ht 5\' 8"  (1.727 m)  Wt 123.378 kg (272 lb)  BMI 41.36 kg/m2  Breastfeeding? Unknown Abdomen: Nontender  Cervix: Closed and long. Stitch is intact.  Fetal heart tones: Stable  Maternal-fetal medicine consult: Cervix is closed and long. Okay to discharge to home.  Plan:  We will discharge the patient home. She will maintain bed rest. She returned to the office next week for reevaluation. The patient was allowed to ambulate in the room and there was no cervical change.

## 2011-09-26 NOTE — Plan of Care (Signed)
Problem: Consults Goal: Birthing Suites Patient Information Press F2 to bring up selections list   Pt < [redacted] weeks EGA     

## 2011-09-27 LAB — URINE CULTURE: Culture  Setup Time: 201211020250

## 2011-09-28 LAB — CULTURE, BETA STREP (GROUP B ONLY)

## 2011-09-28 NOTE — Discharge Summary (Signed)
Gina Schneider  Date of birth: 11-Jan-1976  Medical records #[id number]  CSN: [id number]  Date of admission: 09/25/2011  Date of discharge: 09/26/2011  Procedures this admission: Maternal fetal medicine consult with ultrasound  History of present illness:  The patient is a 35 year old female, gravida 5 para 0-to-to-2, who presents at 17 weeks and 4 day gestation. The patient has been followed [facility] [place] [facility]. Her pregnancy has been complicated by history of preterm deliveries. She has an incompetent cervix and has a cerclage in place. The patient was noted to have cervical thinning. A previous ultrasound showed a cervix that was 4.8 cm. Her most recent ultrasound showed a cervix that measures 0.8 cm. The patient denies vaginal bleeding and leaking of fluid. Please see her history and physical exam for details.  The patient also has a history of asthma. She is on very is different medications. She complains of a chronic cough.  Hospital course the patient was placed on bed rest in the hospital. A repeat ultrasound was performed by the maternal fetal medicine physician. Her cervix was noted to be closed and long. The patient was examined and her cervix was closed and long on examination. She was allowed to ambulate. Her cervix remained stable. The fetal heart tones were stable. The patient was discharged to home.  The patient's asthma remained stable. She did have a respiratory therapy consult with a breathing treatment.  Discharge instructions the patient will return to the office in 2-3 days for reevaluation. She will maintain bed rest home. If her cervix remains stable we will allow her to ambulate after her office visit.  She will continue her vaginal progesterone. She was given the following medication recommendations to help her asthma:  Xopenex MDI 2 puffs Q6prn as needed and continue the QVAR MDI 2 puffs QAM. She  can still use her HHN's with Xopenex when needed as well.  Mylinda Latina.D.

## 2011-10-02 NOTE — Progress Notes (Signed)
UR Chart review completed.  

## 2011-10-27 NOTE — Progress Notes (Signed)
Summary: DIARRHEA/LUMPS UNDER RIGHT ARM Room 5   Vital Signs:  Patient Profile:   35 Years Old Female CC:      Diahhrea x 1 wk, lump under rt arm x 1 wk Height:     68 inches Weight:      266 pounds O2 Sat:      98 % O2 treatment:    Room Air Temp:     99.0 degrees F oral Pulse rate:   87 / minute Pulse rhythm:   regular Resp:     18 per minute BP sitting:   112 / 72  (left arm) Cuff size:   large  Vitals Entered By: Emilio Math (July 16, 2011 8:44 AM)                  Prior Medication List:  PROTONIX 40 MG TBEC (PANTOPRAZOLE SODIUM)  CEPHALEXIN 500 MG CAPS (CEPHALEXIN) One by mouth two times a day DIFLUCAN 150 MG TABS (FLUCONAZOLE) One by mouth as a single dose.  May repeat in 3 to 4 days.   Current Allergies: No known allergies History of Present Illness Chief Complaint: Diahhrea x 1 wk, lump under rt arm x 1 wk History of Present Illness: Patient has had diarrhea for a week and a lump under her R aerm for  aweek aswell. The diarrhea was of concern because she and her son were the only ones to eat a canalouple on the [employer]  a week ago and they were the only ones who got sick. her son has completely recovered. She has had a boil on her Lleg that had to be surgicaly open before there are certain antibiotics that she can not take and she is [redacted] weeks pregnant. She lost a twin pregnancy in the spring.  Current Problems: PREGNANCY (ICD-V22.2) CELLULITIS AND ABSCESS OF UPPER ARM AND FOREARM (ICD-682.3) DIARRHEA (ICD-787.91) TIC (ICD-307.20) DYSURIA (ICD-788.1)   Current Meds PRENAVITE MULTIPLE VITAMIN 28-0.8 MG TABS (PRENATAL VIT-FE FUMARATE-FA)  BACTROBAN 2 % OINT (MUPIROCIN) apply to area twice a day for 10 days  REVIEW OF SYSTEMS Constitutional Symptoms      Denies fever, chills, night sweats, weight loss, weight gain, and fatigue.  Eyes       Denies change in vision, eye pain, eye discharge, glasses, contact lenses, and eye surgery. Ear/Nose/Throat/Mouth       Denies hearing loss/aids, change in hearing, ear pain, ear discharge, dizziness, frequent runny nose, frequent nose bleeds, sinus problems, sore throat, hoarseness, and tooth pain or bleeding.  Respiratory       Denies dry cough, productive cough, wheezing, shortness of breath, asthma, bronchitis, and emphysema/COPD.  Cardiovascular       Denies murmurs, chest pain, and tires easily with exhertion.    Gastrointestinal       Complains of diarrhea.      Denies stomach pain, nausea/vomiting, constipation, blood in bowel movements, and indigestion. Genitourniary       Denies painful urination, kidney stones, and loss of urinary control. Neurological       Denies paralysis, seizures, and fainting/blackouts. Musculoskeletal       Denies muscle pain, joint pain, joint stiffness, decreased range of motion, redness, swelling, muscle weakness, and gout.  Skin       Complains of unusual moles/lumps or sores.      Denies bruising and hair/skin or nail changes.  Psych       Denies mood changes, temper/anger issues, anxiety/stress, speech problems, depression, and sleep problems.  Past History:  Family History: Last updated: 01/15/2011 none  Social History: Last updated: 07/16/2011 Never Smoked Alcohol use-yes 1 per mth Drug use-no Currently [redacted] wks pregnant  Risk Factors: Smoking Status: never (01/15/2011)  Past Medical History: Reviewed history from 01/15/2011 and no changes required. none  Past Surgical History: Reviewed history from 01/15/2011 and no changes required. lymph node removal  1981- cat scratch fever  Family History: Reviewed history from 01/15/2011 and no changes required. none  Social History: Reviewed history from 01/15/2011 and no changes required. Never Smoked Alcohol use-yes 1 per mth Drug use-no Currently [redacted] wks pregnant Physical Exam General appearance: obese, well developed, well nourished, no acute distress Head: normocephalic,  atraumatic Extremities: scar present on the L leg from surgery Skin: swollen area under the R axilla consistent w/early abcess or infected hair follicle. MSE: oriented to time, place, and person Assessment Additional workup planned New Problems: PREGNANCY (ICD-V22.2) CELLULITIS AND ABSCESS OF UPPER ARM AND FOREARM (ICD-682.3) DIARRHEA (ICD-787.91)   Patient Education: Patient and/or caregiver instructed in the following: rest, fluids.  Plan New Medications/Changes: BACTROBAN 2 % OINT (MUPIROCIN) apply to area twice a day for 10 days  #1 tube x 1, 07/16/2011, Hassan Rowan MD  New Orders: T-Culture, Stool [[id number]] T-GI OP (ova & parasites) [[id number]] T-Clostridium difficile Toxin A/B [[id number]] Est. Patient Level IV [44010] Follow Up: Follow up in 2-3 days if no improvement, Follow up on an as needed basis, Follow up with Primary Physician  The patient and/or caregiver has been counseled thoroughly with regard to medications prescribed including dosage, schedule, interactions, rationale for use, and possible side effects and they verbalize understanding.  Diagnoses and expected course of recovery discussed and will return if not improved as expected or if the condition worsens. Patient and/or caregiver verbalized understanding.  Prescriptions: BACTROBAN 2 % OINT (MUPIROCIN) apply to area twice a day for 10 days  #1 tube x 1   Entered and Authorized by:   Hassan Rowan MD   Signed by:   Hassan Rowan MD on 07/16/2011   Method used:   Print then Give to Patient   RxID:   [id number]   Patient Instructions: 1)  Please schedule an appointment with your primary doctor in :5-7 days if not better.  2)  Will use Bactroban ointment since catagory B and Septa is C  and doxycyclione is D.  3)  If stool culture comes back + for pathogen and her symptoms continue will treat at that tme due to pregnancy.  4)  Both lomotil and imodium and peptobismol are catagory C so I would  recomend avoiding them at this time.  Orders Added: 1)  T-Culture, Stool [[id number]] 2)  T-GI OP (ova & parasites) [[id number]] 3)  T-Clostridium difficile Toxin A/B [[id number]] 4)  Est. Patient Level IV [32951]

## 2011-11-08 ENCOUNTER — Observation Stay (HOSPITAL_COMMUNITY)
Admission: AD | Admit: 2011-11-08 | Discharge: 2011-11-09 | DRG: 781 | Disposition: A | Discharge status: Home / Self Care | Payer: Managed Care, Other (non HMO) | Source: Ambulatory Visit | Attending: Obstetrics and Gynecology | Admitting: Obstetrics and Gynecology

## 2011-11-08 ENCOUNTER — Inpatient Hospital Stay (HOSPITAL_COMMUNITY): Payer: Managed Care, Other (non HMO)

## 2011-11-08 ENCOUNTER — Encounter (HOSPITAL_COMMUNITY): Payer: Self-pay

## 2011-11-08 DIAGNOSIS — N949 Unspecified condition associated with female genital organs and menstrual cycle: Secondary | ICD-10-CM | POA: Diagnosis present

## 2011-11-08 DIAGNOSIS — O99891 Other specified diseases and conditions complicating pregnancy: Secondary | ICD-10-CM | POA: Diagnosis present

## 2011-11-08 DIAGNOSIS — R102 Pelvic and perineal pain: Secondary | ICD-10-CM

## 2011-11-08 DIAGNOSIS — O3430 Maternal care for cervical incompetence, unspecified trimester: Principal | ICD-10-CM | POA: Diagnosis present

## 2011-11-08 DIAGNOSIS — R21 Rash and other nonspecific skin eruption: Secondary | ICD-10-CM | POA: Diagnosis present

## 2011-11-08 HISTORY — DX: Gastro-esophageal reflux disease without esophagitis: K21.9

## 2011-11-08 LAB — URINALYSIS, ROUTINE W REFLEX MICROSCOPIC
Bilirubin Urine: NEGATIVE
Ketones, ur: NEGATIVE mg/dL
Leukocytes, UA: NEGATIVE
Nitrite: NEGATIVE
Protein, ur: NEGATIVE mg/dL

## 2011-11-08 LAB — CBC
Hemoglobin: 11.8 g/dL — ABNORMAL LOW (ref 12.0–15.0)
MCH: 27.7 pg (ref 26.0–34.0)
MCHC: 32.4 g/dL (ref 30.0–36.0)
MCV: 85.4 fL (ref 78.0–100.0)
Platelets: 259 10*3/uL (ref 150–400)

## 2011-11-08 MED ORDER — IBUPROFEN 600 MG PO TABS
600.0000 mg | ORAL_TABLET | Freq: Four times a day (QID) | ORAL | Status: DC | PRN
  Administered 2011-11-08: 600 mg via ORAL
  Filled 2011-11-08: qty 1

## 2011-11-08 MED ORDER — ZOLPIDEM TARTRATE 10 MG PO TABS
10.0000 mg | ORAL_TABLET | Freq: Every evening | ORAL | Status: DC | PRN
  Administered 2011-11-08: 10 mg via ORAL
  Filled 2011-11-08: qty 1

## 2011-11-08 MED ORDER — PROGESTERONE MICRONIZED 200 MG PO CAPS
200.0000 mg | ORAL_CAPSULE | Freq: Every day | ORAL | Status: DC
  Administered 2011-11-08: 200 mg via VAGINAL
  Filled 2011-11-08 (×2): qty 1

## 2011-11-08 MED ORDER — PRENATAL PLUS 27-1 MG PO TABS
1.0000 | ORAL_TABLET | Freq: Every day | ORAL | Status: DC
  Administered 2011-11-08 – 2011-11-09 (×2): 1 via ORAL
  Filled 2011-11-08 (×2): qty 1

## 2011-11-08 MED ORDER — DIPHENHYDRAMINE HCL 25 MG PO CAPS
25.0000 mg | ORAL_CAPSULE | Freq: Four times a day (QID) | ORAL | Status: DC | PRN
  Administered 2011-11-08 – 2011-11-09 (×2): 25 mg via ORAL
  Filled 2011-11-08 (×2): qty 1

## 2011-11-08 MED ORDER — ONDANSETRON 4 MG PO TBDP
4.0000 mg | ORAL_TABLET | Freq: Three times a day (TID) | ORAL | Status: DC | PRN
  Filled 2011-11-08: qty 1

## 2011-11-08 MED ORDER — CALCIUM CARBONATE ANTACID 500 MG PO CHEW: 2.0000 | CHEWABLE_TABLET | ORAL | Status: DC | PRN

## 2011-11-08 MED ORDER — ACETAMINOPHEN 325 MG PO TABS: 650.0000 mg | ORAL_TABLET | ORAL | Status: DC | PRN

## 2011-11-08 MED ORDER — PANTOPRAZOLE SODIUM 40 MG PO TBEC
40.0000 mg | DELAYED_RELEASE_TABLET | Freq: Every day | ORAL | Status: DC
  Administered 2011-11-08 – 2011-11-09 (×2): 40 mg via ORAL
  Filled 2011-11-08 (×3): qty 1

## 2011-11-08 MED ORDER — DOCUSATE SODIUM 100 MG PO CAPS
100.0000 mg | ORAL_CAPSULE | Freq: Every day | ORAL | Status: DC
  Administered 2011-11-08 – 2011-11-09 (×2): 100 mg via ORAL
  Filled 2011-11-08 (×2): qty 1

## 2011-11-08 MED ORDER — IBUPROFEN 600 MG PO TABS
600.0000 mg | ORAL_TABLET | Freq: Once | ORAL | Status: AC
  Administered 2011-11-08: 600 mg via ORAL
  Filled 2011-11-08: qty 1

## 2011-11-08 MED ORDER — HYDROCORTISONE 1 % EX CREA
TOPICAL_CREAM | Freq: Four times a day (QID) | CUTANEOUS | Status: DC | PRN
  Administered 2011-11-08: 15:00:00 via TOPICAL
  Filled 2011-11-08: qty 28

## 2011-11-08 NOTE — Progress Notes (Signed)
Pt reports having increased sharp pelvic pain/pressure. Pt has cerclage in. Pt took ibuprofin without releif. Tried several different psosisions unable to get comfortable. CNM told her to come in to be evaluated

## 2011-11-08 NOTE — ED Provider Notes (Signed)
History     Chief Complaint  Patient presents with  . Abdominal Pain   HPI Pt is a 35yo G5 P0,2,2,1 who presents at 23 weeks 3 days with c/o of increased pelvic pressure and pain which began approximately 24hrs ago.  She spoke with S. Lilliard last PM and treated her pain with Motrin 800mg  x 1 dose without relief.  She denies cramping.  She report the pain is located mostly over her pubic bone and it is worsened by moving as well as certain motion such as picking up her legs to go up the stairs.  She denies ROM or bldg.  She reports normal fetal movement.  She is currently not on activity restrictions.  She does have a significant hx of PTD x 2.  Her last cervical length was performed approximately 2 weeks ago and the pt reports the length as normal.  She denies any recent intercourse.  She denies any increased vaginal discharge.  She states she is having difficulty with putting her legs together due to the pain.    OB History    Grav Para Term Preterm Abortions TAB SAB Ect Mult Living   5 2 0 2 2  2  1 2       Past Medical History  Diagnosis Date  . PONV (postoperative nausea and vomiting)   . Asthma     allergy induced asthma  . Reflux   . Headache     otc med - prn  . Anxiety     hx - no meds    Past Surgical History  Procedure Date  . Lympth node     left - armpit  . Bone spurs     left and right feet  . Ankle surgery     left ankle infection - exploratory surg  . Breast enhancement surgery   . Replacement breast augmentation      x 2  . Nasal sinus surgery   . Cesarean section   . Dilation and curettage of uterus   . Cervical cerclage 09/10/2011    Procedure: CERCLAGE CERVICAL;  Surgeon: Michael Litter, MD;  Location: [facility];  Service: Gynecology;  Laterality: N/A;    Family History  Problem Relation Age of Onset  . Diabetes Mother   . Depression Mother   . Depression Sister   . Heart disease Maternal Grandfather   . Diabetes Paternal Grandmother   . Stroke  Paternal Grandmother   . Heart disease Paternal Grandfather   . Hypertension Paternal Grandfather     History  Substance Use Topics  . Smoking status: Never Smoker   . Smokeless tobacco: Never Used  . Alcohol Use: Yes     occasionally but none with pregnancy    Allergies:  Allergies  Allergen Reactions  . Augmentin Nausea And Vomiting and Rash  . Ceclor (Cefaclor) Rash    Prescriptions prior to admission  Medication Sig Dispense Refill  . OVER THE COUNTER MEDICATION Take 2 tablets by mouth daily. Patient takes vitafusion prenatal gummies       . pantoprazole (PROTONIX) 40 MG tablet Take 40 mg by mouth daily.        . progesterone (PROMETRIUM) 200 MG capsule Take 1 capsule (200 mg total) by mouth daily.  30 capsule  5  . DISCONTD: ondansetron (ZOFRAN-ODT) 4 MG disintegrating tablet Take 4 mg by mouth every 8 (eight) hours as needed. For nausea and vomiting      . DISCONTD: pseudoephedrine (SUDAFED) 30  MG tablet Take 60 mg by mouth as needed. Congestion          Review of Systems  Constitutional: Negative.   HENT: Negative.   Eyes: Negative.   Respiratory: Negative.   Cardiovascular: Negative.   Gastrointestinal: Negative.   Genitourinary: Negative.   Musculoskeletal: Negative.   Skin: Positive for rash.       Bilateral upper arms and on ankles at times.  Had in previous pregnancy as well.    Neurological: Negative.   Endo/Heme/Allergies: Negative.   Psychiatric/Behavioral: Negative.    Physical Exam   Blood pressure 121/67, pulse 102, temperature 98.6 F (37 C), temperature source Oral, resp. rate 18, height 5' 7.5" (1.715 m), weight 123.197 kg (271 lb 9.6 oz).  Physical Exam  Constitutional: She is oriented to person, place, and time. She appears well-developed and well-nourished.  HENT:  Head: Normocephalic and atraumatic.  Right Ear: External ear normal.  Left Ear: External ear normal.  Nose: Nose normal.  Eyes: Conjunctivae are normal. Pupils are equal,  round, and reactive to light.  Neck: Normal range of motion. Neck supple. No thyromegaly present.  Cardiovascular: Normal rate, regular rhythm and intact distal pulses.   Respiratory: Effort normal and breath sounds normal.  GI: Soft. Bowel sounds are normal.  Genitourinary: Vagina normal and uterus normal.       Vagina with sm amt of white discharge in vault.  Cerclage visibly intact and cervix visually closed.   SVE: Dilation closed Effacement: 30% Station: -3. Cerclage intact to palpation.  Musculoskeletal: Normal range of motion.  Neurological: She is alert and oriented to person, place, and time. She has normal reflexes.  Skin: Skin is warm and dry. Rash noted.       Papular erythematous rash noted medial bilateral upper arms.    Psychiatric: She has a normal mood and affect. Her behavior is normal. Judgment and thought content normal.   FHR tracing intermittently.  FHR baseline 140 with moderate variability.  No decels noted.  FHR tracing appropriate for gestational age.\ Toco:  No UCs or uterine irritability noted.   MAU Course  Procedures   Assessment and Plan  IUP at 23weeks 3 days Pubic symphysis pain S/P cerclage Hx PTD x 2  Consult with Dr. Stefano Gaul.  Will check FFN and cervical length.    Kieran Arreguin O. 11/08/2011, 1:44 PM

## 2011-11-08 NOTE — Progress Notes (Signed)
35 y.o. year old female,at [redacted]w[redacted]d gestation.  SUBJECTIVE:  C/O itching  OBJECTIVE:  BP 119/63  Pulse 94  Temp(Src) 98.1 F (36.7 C) (Oral)  Resp 20  Ht 5' 7.5" (1.715 m)  Wt 123.197 kg (271 lb 9.6 oz)  BMI 41.91 kg/m2  Fetal Heart Tones:  Stable  Contractions:          Few, Irritability noted   Rash on extremities  ASSESSMENT:  [redacted]w[redacted]d Weeks Pregnancy  Incomp Cervix  Rash  PLAN:  Obs in hospital  Benadryl  Leonard Schwartz, M.D.

## 2011-11-08 NOTE — H&P (Signed)
Abdominal Pain    HPI  Pt is a 35yo G5 P0,2,2,1 who presents at 23 weeks 3 days with c/o of increased pelvic pressure and pain which began approximately 24hrs ago. She spoke with S. Lilliard last PM and treated her pain with Motrin 800mg  x 1 dose without relief. She denies cramping. She report the pain is located mostly over her pubic bone and it is worsened by moving as well as certain motion such as picking up her legs to go up the stairs. She denies ROM or bldg. She reports normal fetal movement. She is currently not on activity restrictions. She does have a significant hx of PTD x 2. Her last cervical length was performed approximately 2 weeks ago and the pt reports the length as normal. She denies any recent intercourse. She denies any increased vaginal discharge. She states she is having difficulty with putting her legs together due to the pain.  OB History    Grav  Para  Term  Preterm  Abortions  TAB  SAB  Ect  Mult  Living    5  2  0  2  2   2   1  2       Past Medical History   Diagnosis  Date   .  PONV (postoperative nausea and vomiting)    .  Asthma      allergy induced asthma   .  Reflux    .  Headache      otc med - prn   .  Anxiety      hx - no meds    Past Surgical History   Procedure  Date   .  Lympth node      left - armpit   .  Bone spurs      left and right feet   .  Ankle surgery      left ankle infection - exploratory surg   .  Breast enhancement surgery    .  Replacement breast augmentation      x 2   .  Nasal sinus surgery    .  Cesarean section    .  Dilation and curettage of uterus    .  Cervical cerclage  09/10/2011     Procedure: CERCLAGE CERVICAL; Surgeon: Michael Litter, MD; Location: [facility]; Service: Gynecology; Laterality: N/A;    Family History   Problem  Relation  Age of Onset   .  Diabetes  Mother    .  Depression  Mother    .  Depression  Sister    .  Heart disease  Maternal Grandfather    .  Diabetes  Paternal Grandmother    .  Stroke   Paternal Grandmother    .  Heart disease  Paternal Grandfather    .  Hypertension  Paternal Grandfather     History   Substance Use Topics   .  Smoking status:  Never Smoker   .  Smokeless tobacco:  Never Used   .  Alcohol Use:  Yes      occasionally but none with pregnancy    Allergies:  Allergies   Allergen  Reactions   .  Augmentin  Nausea And Vomiting and Rash   .  Ceclor (Cefaclor)  Rash    Prescriptions prior to admission   Medication  Sig  Dispense  Refill   .  OVER THE COUNTER MEDICATION  Take 2 tablets by mouth daily. Patient takes  vitafusion prenatal gummies     .  pantoprazole (PROTONIX) 40 MG tablet  Take 40 mg by mouth daily.     .  progesterone (PROMETRIUM) 200 MG capsule  Take 1 capsule (200 mg total) by mouth daily.  30 capsule  5   .  DISCONTD: ondansetron (ZOFRAN-ODT) 4 MG disintegrating tablet  Take 4 mg by mouth every 8 (eight) hours as needed. For nausea and vomiting     .  DISCONTD: pseudoephedrine (SUDAFED) 30 MG tablet  Take 60 mg by mouth as needed. Congestion      Review of Systems  Constitutional: Negative.  HENT: Negative.  Eyes: Negative.  Respiratory: Negative.  Cardiovascular: Negative.  Gastrointestinal: Negative.  Genitourinary: Negative.  Musculoskeletal: Negative.  Skin: Positive for rash.  Bilateral upper arms and on ankles at times. Had in previous pregnancy as well.  Neurological: Negative.  Endo/Heme/Allergies: Negative.  Psychiatric/Behavioral: Negative.   Physical Exam   Blood pressure 121/67, pulse 102, temperature 98.6 F (37 C), temperature source Oral, resp. rate 18, height 5' 7.5" (1.715 m), weight 123.197 kg (271 lb 9.6 oz).  Physical Exam  Constitutional: She is oriented to person, place, and time. She appears well-developed and well-nourished.  HENT:  Head: Normocephalic and atraumatic.  Right Ear: External ear normal.  Left Ear: External ear normal.  Nose: Nose normal.  Eyes: Conjunctivae are normal. Pupils are  equal, round, and reactive to light.  Neck: Normal range of motion. Neck supple. No thyromegaly present.  Cardiovascular: Normal rate, regular rhythm and intact distal pulses.  Respiratory: Effort normal and breath sounds normal.  GI: Soft. Bowel sounds are normal.  Genitourinary: Vagina normal and uterus normal.  Vagina with sm amt of white discharge in vault. Cerclage visibly intact and cervix visually closed.  SVE: Dilation closed Effacement: 30% Station: -3. Cerclage intact to palpation.  Musculoskeletal: Normal range of motion.  Neurological: She is alert and oriented to person, place, and time. She has normal reflexes.  Skin: Skin is warm and dry. Rash noted.  Papular erythematous rash noted medial bilateral upper arms.  Psychiatric: She has a normal mood and affect. Her behavior is normal. Judgment and thought content normal.   FHR tracing intermittently. FHR baseline 140 with moderate variability. No decels noted. FHR tracing appropriate for gestational age.\  Toco: No UCs or uterine irritability noted.  MAU Course   Procedures  Results for orders placed during the hospital encounter of 11/08/11 (from the past 24 hour(s))  URINALYSIS, ROUTINE W REFLEX MICROSCOPIC     Status: Normal   Collection Time   11/08/11 12:43 PM      Component Value Range   Color, Urine YELLOW  YELLOW    APPearance CLEAR  CLEAR    Specific Gravity, Urine 1.020  1.005 - 1.030    pH 6.0  5.0 - 8.0    Glucose, UA NEGATIVE  NEGATIVE (mg/dL)   Hgb urine dipstick NEGATIVE  NEGATIVE    Bilirubin Urine NEGATIVE  NEGATIVE    Ketones, ur NEGATIVE  NEGATIVE (mg/dL)   Protein, ur NEGATIVE  NEGATIVE (mg/dL)   Urobilinogen, UA 0.2  0.0 - 1.0 (mg/dL)   Nitrite NEGATIVE  NEGATIVE    Leukocytes, UA NEGATIVE  NEGATIVE   FETAL FIBRONECTIN     Status: Abnormal   Collection Time   11/08/11  1:05 PM      Component Value Range   Fetal Fibronectin POSITIVE (*) NEGATIVE    Ultrasound for cervical length-Cx length  2.7cms.  Cerclage noted to be eccentric and at ext os.  Suspected funneling but no measurement.    Assessment and Plan   IUP at 23weeks 3 days  Pubic symphysis pain  S/P cerclage  Hx PTD x 2   Plan: Consult with Dr. Stefano Gaul Admit to Antepartum for 23 hr observation. Re-eval cervical length tomorrow.

## 2011-11-09 ENCOUNTER — Inpatient Hospital Stay (HOSPITAL_COMMUNITY): Payer: Managed Care, Other (non HMO)

## 2011-11-09 MED ORDER — NIFEDIPINE 10 MG PO CAPS: 10.0000 mg | ORAL_CAPSULE | Freq: Four times a day (QID) | ORAL | Status: DC | PRN

## 2011-11-09 MED ORDER — IBUPROFEN 600 MG PO TABS: 600.0000 mg | ORAL_TABLET | Freq: Four times a day (QID) | ORAL | Status: AC | PRN

## 2011-11-09 MED ORDER — PANTOPRAZOLE SODIUM 40 MG PO TBEC: 40.0000 mg | DELAYED_RELEASE_TABLET | Freq: Every day | ORAL | Status: DC

## 2011-11-09 MED ORDER — PROGESTERONE MICRONIZED 200 MG PO CAPS: 200.0000 mg | ORAL_CAPSULE | Freq: Every day | ORAL | Status: DC

## 2011-11-09 NOTE — Discharge Summary (Signed)
Pt given discharge instructions, verbalizes understanding. Sanda Klein at bedside

## 2011-11-09 NOTE — Discharge Summary (Signed)
Physician Discharge Summary  Patient ID: Gina Schneider MRN: [id number] DOB/AGE: 1976-03-08 35 y.o.  Admit date: 11/08/2011 Discharge date: 11/09/2011  Admission Diagnoses: PTL circlage in place  Discharge Diagnoses: same Active Problems:  * No active hospital problems. *    Discharged Condition: stable  Hospital Course: Pt had a pelvic exam including FFN that was pos. An US showed cervical length of 2.7cm and question of funneling, circlage visible on L side at ext os. She remained on bedrest overnight and a repeat US today showed cervical length of 2.5cm and no evidence of funneling. Circlage visible on L side and at ext os.   Consults: none  Significant Diagnostic Studies: ultrasound   Treatments: vaginal progesterone.   Discharge Exam: Blood pressure 104/52, pulse 100, temperature 97.8 F (36.6 C), temperature source Oral, resp. rate 20, height 5' 7.5" (1.715 m), weight 123.197 kg (271 lb 9.6 oz). Gen: A&O x3, no distress Chest: clear Abdomen: soft, nt, gravid,  Pelvic: deferred Ext: WNL  FHR 150 toco quiet, some occ UI  Disposition: Home or Self Care   Discharge Medication List as of 11/09/2011  5:50 PM    START taking these medications   Details  ibuprofen (ADVIL,MOTRIN) 600 MG tablet Take 1 tablet (600 mg total) by mouth every 6 (six) hours as needed for pain (6 hrs from last dose.)., Starting 11/09/2011, Until Wed 11/19/11, Print    NIFEdipine (PROCARDIA) 10 MG capsule Take 1 capsule (10 mg total) by mouth every 6 (six) hours as needed (for contractions)., Starting 11/09/2011, Until Mon 11/08/12, Print    !! pantoprazole (PROTONIX) 40 MG tablet Take 1 tablet (40 mg total) by mouth daily., Starting 11/09/2011, Until Mon 11/08/12, Print     !! - Potential duplicate medications found. Please discuss with provider.    CONTINUE these medications which have CHANGED   Details  progesterone (PROMETRIUM) 200 MG capsule Take 1 capsule (200 mg total) by mouth at  bedtime., Starting 11/09/2011, Until Mon 11/08/12, Print      CONTINUE these medications which have NOT CHANGED   Details  OVER THE COUNTER MEDICATION Take 2 tablets by mouth daily. Patient takes vitafusion prenatal gummies , Until Discontinued, Historical Med    !! pantoprazole (PROTONIX) 40 MG tablet Take 40 mg by mouth daily.  , Until Discontinued, Historical Med     !! - Potential duplicate medications found. Please discuss with provider.    STOP taking these medications     ondansetron (ZOFRAN-ODT) 4 MG disintegrating tablet      pseudoephedrine (SUDAFED) 30 MG tablet        Follow-up Information    Follow up with [facility], MD. (as scheduled on Wednesday)    Contact information:   [address] [place] [address] [phone]          Signed: Malissa Hippo 11/09/2011, 6:10 PM

## 2011-11-09 NOTE — Progress Notes (Signed)
36 y.o. year old female,at [redacted]w[redacted]d gestation.  SUBJECTIVE:  The patient feels better this morning. Her pelvic discomfort has resolved. Her itching is much better.  OBJECTIVE:  BP 108/49  Pulse 107  Temp(Src) 97.8 F (36.6 C) (Oral)  Resp 18  Ht 5' 7.5" (1.715 m)  Wt 123.197 kg (271 lb 9.6 oz)  BMI 41.91 kg/m2  Fetal Heart Tones:  Stable  Contractions:          Minimal  Chest: Clear  Heart: Regular rate and rhythm  Abdomen: Nontender, gravid  ASSESSMENT:  [redacted]w[redacted]d Weeks Pregnancy  Incompetent cervix  Improved pelvic pain  Improved itching  Positive fetal fibronectin  PLAN:  We will check the patient's cervix by ultrasound. As long as the cervical length is stable then the patient will be discharged to home. The patient will return to see Dr. Normand Sloop on 11/13/2011. She will be given betamethasone at that time. An ultrasound is scheduled to measure cervical length. The patient will increase her bed rest at home. She understands to call if she has any problems.  Discharge medications:  Ibuprofen 800 mg every 8 hours as needed  Procardia 10 mg every 6 hours as needed for contractions  Prometrium 200 mg vaginally each day  Leonard Schwartz, M.D.

## 2011-11-11 NOTE — Progress Notes (Signed)
UR Chart review completed.  

## 2011-11-12 ENCOUNTER — Other Ambulatory Visit: Payer: Self-pay | Admitting: Obstetrics and Gynecology

## 2011-11-12 ENCOUNTER — Inpatient Hospital Stay (HOSPITAL_COMMUNITY)
Admission: AD | Admit: 2011-11-12 | Discharge: 2011-11-12 | Disposition: A | Discharge status: Home / Self Care | Payer: Managed Care, Other (non HMO) | Source: Ambulatory Visit | Attending: Obstetrics and Gynecology | Admitting: Obstetrics and Gynecology

## 2011-11-12 DIAGNOSIS — O4700 False labor before 37 completed weeks of gestation, unspecified trimester: Secondary | ICD-10-CM | POA: Insufficient documentation

## 2011-11-12 MED ORDER — BETAMETHASONE SOD PHOS & ACET 6 (3-3) MG/ML IJ SUSP
12.0000 mg | Freq: Once | INTRAMUSCULAR | Status: AC
  Administered 2011-11-12: 12 mg via INTRAMUSCULAR
  Filled 2011-11-12: qty 2

## 2011-11-12 NOTE — Progress Notes (Signed)
Pt states, " I saw Dr. Normand Sloop today and she wanted me to come in for Betamethazone injections today and tomorrow. I turned 24 wks today, and I have a history of preterm delivery, and have a cerclage inplace."

## 2011-11-13 ENCOUNTER — Inpatient Hospital Stay (HOSPITAL_COMMUNITY)
Admission: AD | Admit: 2011-11-13 | Discharge: 2011-11-13 | Disposition: A | Discharge status: Home / Self Care | Payer: Managed Care, Other (non HMO) | Source: Ambulatory Visit | Attending: Obstetrics and Gynecology | Admitting: Obstetrics and Gynecology

## 2011-11-13 DIAGNOSIS — O4700 False labor before 37 completed weeks of gestation, unspecified trimester: Secondary | ICD-10-CM | POA: Insufficient documentation

## 2011-11-13 MED ORDER — BETAMETHASONE SOD PHOS & ACET 6 (3-3) MG/ML IJ SUSP
12.0000 mg | Freq: Once | INTRAMUSCULAR | Status: AC
  Administered 2011-11-13: 12 mg via INTRAMUSCULAR
  Filled 2011-11-13: qty 2

## 2011-11-20 ENCOUNTER — Inpatient Hospital Stay (HOSPITAL_COMMUNITY)
Admission: AD | Admit: 2011-11-20 | Discharge: 2011-11-20 | Disposition: A | Discharge status: Home / Self Care | Payer: Managed Care, Other (non HMO) | Source: Ambulatory Visit | Attending: Obstetrics and Gynecology | Admitting: Obstetrics and Gynecology

## 2011-11-20 ENCOUNTER — Encounter (HOSPITAL_COMMUNITY): Payer: Self-pay | Admitting: *Deleted

## 2011-11-20 DIAGNOSIS — R109 Unspecified abdominal pain: Secondary | ICD-10-CM | POA: Insufficient documentation

## 2011-11-20 DIAGNOSIS — O99891 Other specified diseases and conditions complicating pregnancy: Secondary | ICD-10-CM | POA: Insufficient documentation

## 2011-11-20 DIAGNOSIS — O3430 Maternal care for cervical incompetence, unspecified trimester: Secondary | ICD-10-CM

## 2011-11-20 DIAGNOSIS — Z98891 History of uterine scar from previous surgery: Secondary | ICD-10-CM

## 2011-11-20 LAB — URINALYSIS, ROUTINE W REFLEX MICROSCOPIC
Glucose, UA: NEGATIVE mg/dL
Ketones, ur: NEGATIVE mg/dL
Nitrite: NEGATIVE
Specific Gravity, Urine: 1.03 (ref 1.005–1.030)
pH: 6 (ref 5.0–8.0)

## 2011-11-20 LAB — URINE MICROSCOPIC-ADD ON

## 2011-11-20 NOTE — Progress Notes (Signed)
States had +fFN 2 wks ago, was repeated yesterday. States after ride to hospital cramping is less. States now she is not very uncomfortable at all.

## 2011-11-20 NOTE — Progress Notes (Signed)
Cramping since last night. Not real painful, but has hx of PTD X 2, has cerclage. Was advised to come in for evaluation. No leaking or discharge.

## 2011-11-20 NOTE — Progress Notes (Signed)
History   35 yo g5 p2 EDC 4/10 presents with c/o crampy during the night and cramping stopped on the way in drank fluids, rested, took motrin, has rx procardia never filled it discussed use and plan to fill today. No leaking of fluid or vag bleeding, with +FM. Problem list:      cerclage      Hx PTD 33 and 34 weeks      C/S      Obesity        asthma  Chief Complaint  Patient presents with  . Abdominal Cramping     OB History    Grav Para Term Preterm Abortions TAB SAB Ect Mult Living   5 2 0 2 2  2  1 2       Past Medical History  Diagnosis Date  . PONV (postoperative nausea and vomiting)   . Asthma     allergy induced asthma  . Reflux   . Headache     otc med - prn  . Anxiety     hx - no meds  . GERD (gastroesophageal reflux disease)     Past Surgical History  Procedure Date  . Lympth node     left - armpit  . Bone spurs     left and right feet  . Ankle surgery     left ankle infection - exploratory surg  . Breast enhancement surgery   . Replacement breast augmentation      x 2  . Nasal sinus surgery   . Cesarean section   . Dilation and curettage of uterus   . Cervical cerclage 09/10/2011    Procedure: CERCLAGE CERVICAL;  Surgeon: Michael Litter, MD;  Location: [facility];  Service: Gynecology;  Laterality: N/A;  . Cervical cerclage     Family History  Problem Relation Age of Onset  . Diabetes Mother   . Depression Mother   . Depression Sister   . Heart disease Maternal Grandfather   . Diabetes Paternal Grandmother   . Stroke Paternal Grandmother   . Heart disease Paternal Grandfather   . Hypertension Paternal Grandfather     History  Substance Use Topics  . Smoking status: Never Smoker   . Smokeless tobacco: Never Used  . Alcohol Use: Yes     occasionally but none with pregnancy    Allergies:  Allergies  Allergen Reactions  . Augmentin Nausea And Vomiting and Rash  . Ceclor (Cefaclor) Rash    Prescriptions prior to admission  Medication  Sig Dispense Refill  . ibuprofen (ADVIL,MOTRIN) 800 MG tablet Take 800 mg by mouth every 8 (eight) hours as needed. For pain       . OVER THE COUNTER MEDICATION Take 2 tablets by mouth daily. Patient takes vitafusion prenatal gummies       . pantoprazole (PROTONIX) 40 MG tablet Take 1 tablet (40 mg total) by mouth daily.  30 tablet  1  . progesterone 200 MG SUPP Place 200 mg vaginally every evening.        Marland Kitchen ibuprofen (ADVIL,MOTRIN) 600 MG tablet Take 1 tablet (600 mg total) by mouth every 6 (six) hours as needed for pain (6 hrs from last dose.).  30 tablet  0  . NIFEdipine (PROCARDIA) 10 MG capsule Take 1 capsule (10 mg total) by mouth every 6 (six) hours as needed (for contractions).  45 capsule  2    @ROS @ Physical Exam  abd soft, gravid, nt vag LTC suture palpable, FFN from  12/26 neg No UC tracing UA neg  Blood pressure 124/64, pulse 86, temperature 97.7 F (36.5 C), temperature source Oral, resp. rate 18, height 5' 7.5" (1.715 m), weight 124.739 kg (275 lb).    ED Course  35 1/7 week IUP Cerclage P: discharge home, rx procardia discussed s/s ptl to report, f/o office as scheduled. Collaboration with Dr. Stefano Gaul per telephone. Lavera Guise, CNM

## 2011-11-25 NOTE — L&D Delivery Note (Signed)
Delivery Note At 10:19 PM a viable female, "Gina Schneider",  was delivered via Vaginal, Spontaneous Delivery, compound presentation with right hand (Presentation: ; Occiput Anterior).  APGAR: 8, 9; weight 7 lb 8.5 oz (3416 g).   Placenta status: Intact, Spontaneous Pathology.  Cord: 3 vessels with the following complications: None.  Cord pH: NA  Placenta to path due to PROM.  Anesthesia: Local  Episiotomy: None Lacerations: 2nd degree Suture Repair: 3.0 monocryl Est. Blood Loss (mL): 200 cc  Mom to postpartum.  Baby to skin to skin.  Nigel Bridgeman 02/05/2012, 11:03 PM

## 2011-12-29 ENCOUNTER — Encounter (HOSPITAL_COMMUNITY): Payer: Self-pay | Admitting: *Deleted

## 2011-12-29 ENCOUNTER — Inpatient Hospital Stay (HOSPITAL_COMMUNITY)
Admission: AD | Admit: 2011-12-29 | Discharge: 2011-12-29 | Disposition: A | Discharge status: Home / Self Care | Payer: Managed Care, Other (non HMO) | Source: Ambulatory Visit | Attending: Obstetrics and Gynecology | Admitting: Obstetrics and Gynecology

## 2011-12-29 DIAGNOSIS — O99891 Other specified diseases and conditions complicating pregnancy: Secondary | ICD-10-CM | POA: Insufficient documentation

## 2011-12-29 DIAGNOSIS — O3430 Maternal care for cervical incompetence, unspecified trimester: Secondary | ICD-10-CM | POA: Insufficient documentation

## 2011-12-29 LAB — AMNISURE RUPTURE OF MEMBRANE (ROM) NOT AT ARMC: Amnisure ROM: NEGATIVE

## 2011-12-29 LAB — WET PREP, GENITAL: Clue Cells Wet Prep HPF POC: NONE SEEN

## 2011-12-29 NOTE — ED Provider Notes (Signed)
History     Chief Complaint  Patient presents with  . Rupture of Membranes   HPI Comments: Pt is a 35yo [id number] at [redacted]w[redacted]d w c/o leaking fluid, states she felt like she had increased mucous over the weekend then feels like she's had more leaking today. Feels ctx at night but procardia helps, has not had but 2 ctx this AM. Denies VB, +FM.  Has had BMZ this preg at 24wks.        Past Medical History  Diagnosis Date  . PONV (postoperative nausea and vomiting)   . Asthma     allergy induced asthma  . Reflux   . Headache     otc med - prn  . Anxiety     hx - no meds  . GERD (gastroesophageal reflux disease)     Past Surgical History  Procedure Date  . Lympth node     left - armpit  . Bone spurs     left and right feet  . Ankle surgery     left ankle infection - exploratory surg  . Breast enhancement surgery   . Replacement breast augmentation      x 2  . Nasal sinus surgery   . Cesarean section   . Dilation and curettage of uterus   . Cervical cerclage 09/10/2011    Procedure: CERCLAGE CERVICAL;  Surgeon: Michael Litter, MD;  Location: [facility];  Service: Gynecology;  Laterality: N/A;  . Cervical cerclage     Family History  Problem Relation Age of Onset  . Diabetes Mother   . Depression Mother   . Depression Sister   . Heart disease Maternal Grandfather   . Diabetes Paternal Grandmother   . Stroke Paternal Grandmother   . Heart disease Paternal Grandfather   . Hypertension Paternal Grandfather     History  Substance Use Topics  . Smoking status: Never Smoker   . Smokeless tobacco: Never Used  . Alcohol Use: Yes     occasionally but none with pregnancy    Allergies:  Allergies  Allergen Reactions  . Augmentin Nausea And Vomiting and Rash  . Ceclor (Cefaclor) Rash     (Not in a hospital admission)  Review of Systems  Respiratory: Positive for cough.   Genitourinary:       Leaking fluid?    All other systems reviewed and are  negative.   Physical Exam   Blood pressure 128/60, pulse 113, temperature 98.4 F (36.9 C), temperature source Oral, resp. rate 18, height 5\' 6"  (1.676 m), weight 128.368 kg (283 lb), SpO2 98.00%.  Physical Exam  Nursing note and vitals reviewed. Constitutional: She is oriented to person, place, and time. She appears well-developed and well-nourished.  HENT:  Head: Normocephalic.  Cardiovascular: Normal rate.   Respiratory: Effort normal.  GI: Soft.  Genitourinary: Vaginal discharge found.       Some watery d/c noted, fern slide was neg cx was FT/th/anterior, vtx -1 stitch intact   Musculoskeletal: Normal range of motion.  Neurological: She is alert and oriented to person, place, and time.  Skin: Skin is warm and dry.  Psychiatric: She has a normal mood and affect. Her behavior is normal.   FHR 140 reactive no decels  toco quiet  MAU Course  Procedures    Assessment and Plan  IUP at [redacted]w[redacted]d hx PTD Cerclage in place amnisure neg Wet prep neg  D/C home with PTL precautions Continue br at home F/u appt thurs as scheduled  D/w dr Pura Spice 12/29/2011, 2:16 PM

## 2011-12-29 NOTE — Progress Notes (Signed)
Patient states she started having trickling of clear to pink tinged fluid since 2-1 and has yellow mucus discharge. Continued on and off through the weekend. Has had 2 contractions today. Has a history of preterm deliveries and has a cerclage in place. Reports good fetal movement.

## 2011-12-30 LAB — GC/CHLAMYDIA PROBE AMP, GENITAL
Chlamydia, DNA Probe: NEGATIVE
GC Probe Amp, Genital: NEGATIVE

## 2012-01-21 ENCOUNTER — Other Ambulatory Visit: Payer: Managed Care, Other (non HMO)

## 2012-01-21 ENCOUNTER — Encounter (INDEPENDENT_AMBULATORY_CARE_PROVIDER_SITE_OTHER): Payer: Managed Care, Other (non HMO) | Admitting: Obstetrics and Gynecology

## 2012-01-21 DIAGNOSIS — O99210 Obesity complicating pregnancy, unspecified trimester: Secondary | ICD-10-CM

## 2012-01-21 DIAGNOSIS — O09529 Supervision of elderly multigravida, unspecified trimester: Secondary | ICD-10-CM

## 2012-01-21 DIAGNOSIS — Z331 Pregnant state, incidental: Secondary | ICD-10-CM

## 2012-01-21 DIAGNOSIS — O26849 Uterine size-date discrepancy, unspecified trimester: Secondary | ICD-10-CM

## 2012-01-21 DIAGNOSIS — O34599 Maternal care for other abnormalities of gravid uterus, unspecified trimester: Secondary | ICD-10-CM

## 2012-01-21 MED ORDER — BETAMETHASONE SOD PHOS & ACET 6 (3-3) MG/ML IJ SUSP: 12.0000 mg | INTRAMUSCULAR | Status: AC

## 2012-01-28 ENCOUNTER — Inpatient Hospital Stay (HOSPITAL_COMMUNITY)
Admission: AD | Admit: 2012-01-28 | Discharge: 2012-01-28 | Disposition: A | Discharge status: Home / Self Care | Payer: Managed Care, Other (non HMO) | Source: Ambulatory Visit | Attending: Obstetrics and Gynecology | Admitting: Obstetrics and Gynecology

## 2012-01-28 ENCOUNTER — Encounter (HOSPITAL_COMMUNITY): Payer: Self-pay | Admitting: *Deleted

## 2012-01-28 DIAGNOSIS — J45909 Unspecified asthma, uncomplicated: Secondary | ICD-10-CM | POA: Diagnosis present

## 2012-01-28 DIAGNOSIS — R059 Cough, unspecified: Secondary | ICD-10-CM | POA: Insufficient documentation

## 2012-01-28 DIAGNOSIS — R05 Cough: Secondary | ICD-10-CM | POA: Insufficient documentation

## 2012-01-28 DIAGNOSIS — J069 Acute upper respiratory infection, unspecified: Secondary | ICD-10-CM | POA: Insufficient documentation

## 2012-01-28 DIAGNOSIS — O99891 Other specified diseases and conditions complicating pregnancy: Secondary | ICD-10-CM | POA: Insufficient documentation

## 2012-01-28 DIAGNOSIS — R0989 Other specified symptoms and signs involving the circulatory and respiratory systems: Secondary | ICD-10-CM | POA: Insufficient documentation

## 2012-01-28 DIAGNOSIS — Z331 Pregnant state, incidental: Secondary | ICD-10-CM

## 2012-01-28 LAB — COMPREHENSIVE METABOLIC PANEL
ALT: 21 U/L (ref 0–35)
Albumin: 2.6 g/dL — ABNORMAL LOW (ref 3.5–5.2)
Alkaline Phosphatase: 222 U/L — ABNORMAL HIGH (ref 39–117)
BUN: 5 mg/dL — ABNORMAL LOW (ref 6–23)
Chloride: 102 mEq/L (ref 96–112)
GFR calc Af Amer: >90 mL/min (ref >90)
Glucose, Bld: 83 mg/dL (ref 70–99)
Potassium: 3.8 mEq/L (ref 3.5–5.1)
Sodium: 135 mEq/L (ref 135–145)
Total Bilirubin: 0.2 mg/dL — ABNORMAL LOW (ref 0.3–1.2)
Total Protein: 6.5 g/dL (ref 6.0–8.3)

## 2012-01-28 LAB — URINALYSIS, ROUTINE W REFLEX MICROSCOPIC
Nitrite: NEGATIVE
Specific Gravity, Urine: 1.025 (ref 1.005–1.030)
Urobilinogen, UA: 0.2 mg/dL (ref 0.0–1.0)
pH: 6 (ref 5.0–8.0)

## 2012-01-28 LAB — CBC
Hemoglobin: 10.5 g/dL — ABNORMAL LOW (ref 12.0–15.0)
MCH: 24.2 pg — ABNORMAL LOW (ref 26.0–34.0)
Platelets: 273 10*3/uL (ref 150–400)
RBC: 4.34 MIL/uL (ref 3.87–5.11)
WBC: 11.1 10*3/uL — ABNORMAL HIGH (ref 4.0–10.5)

## 2012-01-28 LAB — DIFFERENTIAL
Eosinophils Absolute: 0.2 10*3/uL (ref 0.0–0.7)
Lymphs Abs: 1.4 10*3/uL (ref 0.7–4.0)
Monocytes Relative: 11 % (ref 3–12)
Neutro Abs: 8.3 10*3/uL — ABNORMAL HIGH (ref 1.7–7.7)
Neutrophils Relative %: 75 % (ref 43–77)

## 2012-01-28 LAB — URINE MICROSCOPIC-ADD ON

## 2012-01-28 LAB — STREP B DNA PROBE: GBS: NEGATIVE

## 2012-01-28 NOTE — Progress Notes (Signed)
Started as a virus 3 wks ago, moved into secondary infection/resp- fluid on the lungs.  Was given zpac, has nebulizer at home, using rescue inhaler and taking pulmacort.   Pt feels like is more preg induced asthma- asthma never this bad except with other preg.  Doing ok right now, is mainly worse at night.  Has not had chest xray since symptoms started.  Has a lot of sinus drainage/ gaggs her causes vomiting, burns tongue and throat. Has a cerclage- when coughs feels increased pressure on it and feels like it is tearing, has seen pink splotches  In underwear. irreg cramping in abd and back. Feels like uterine irritability

## 2012-01-28 NOTE — Discharge Instructions (Signed)

## 2012-01-29 ENCOUNTER — Inpatient Hospital Stay (HOSPITAL_COMMUNITY)
Admission: AD | Admit: 2012-01-29 | Discharge: 2012-01-29 | Disposition: A | Discharge status: Home / Self Care | Payer: Managed Care, Other (non HMO) | Attending: Obstetrics and Gynecology | Admitting: Obstetrics and Gynecology

## 2012-01-29 ENCOUNTER — Encounter (HOSPITAL_COMMUNITY): Payer: Self-pay | Admitting: *Deleted

## 2012-01-29 ENCOUNTER — Other Ambulatory Visit: Payer: Self-pay | Admitting: Obstetrics and Gynecology

## 2012-01-29 ENCOUNTER — Encounter (INDEPENDENT_AMBULATORY_CARE_PROVIDER_SITE_OTHER): Payer: Managed Care, Other (non HMO) | Admitting: Obstetrics and Gynecology

## 2012-01-29 DIAGNOSIS — O99210 Obesity complicating pregnancy, unspecified trimester: Secondary | ICD-10-CM

## 2012-01-29 DIAGNOSIS — J069 Acute upper respiratory infection, unspecified: Secondary | ICD-10-CM

## 2012-01-29 DIAGNOSIS — J45909 Unspecified asthma, uncomplicated: Secondary | ICD-10-CM | POA: Insufficient documentation

## 2012-01-29 DIAGNOSIS — N898 Other specified noninflammatory disorders of vagina: Secondary | ICD-10-CM

## 2012-01-29 DIAGNOSIS — O99891 Other specified diseases and conditions complicating pregnancy: Secondary | ICD-10-CM | POA: Insufficient documentation

## 2012-01-29 DIAGNOSIS — O09219 Supervision of pregnancy with history of pre-term labor, unspecified trimester: Secondary | ICD-10-CM

## 2012-01-29 DIAGNOSIS — Z331 Pregnant state, incidental: Secondary | ICD-10-CM

## 2012-01-29 LAB — WET PREP, GENITAL
Trich, Wet Prep: NONE SEEN
Yeast Wet Prep HPF POC: NONE SEEN

## 2012-01-29 NOTE — Progress Notes (Signed)
Possible rupture of membranes at 5am.

## 2012-01-29 NOTE — Progress Notes (Addendum)
History   36 yo g5 P2 35 17 week IUP c/o of leaking fluid when I woke up no leaking since, no vag bleeding, with +FM. In triage 306 for asthma.  No chief complaint on file.  @SFHPI @  OB History    Grav Para Term Preterm Abortions TAB SAB Ect Mult Living   5 2 0 2 2  2  1 2       Past Medical History  Diagnosis Date  . PONV (postoperative nausea and vomiting)   . Asthma     allergy induced asthma  . Reflux   . Headache     otc med - prn  . Anxiety     hx - no meds  . GERD (gastroesophageal reflux disease)     Past Surgical History  Procedure Date  . Lympth node     left - armpit  . Bone spurs     left and right feet  . Ankle surgery     left ankle infection - exploratory surg  . Breast enhancement surgery   . Replacement breast augmentation      x 2  . Nasal sinus surgery   . Cesarean section   . Dilation and curettage of uterus   . Cervical cerclage 09/10/2011    Procedure: CERCLAGE CERVICAL;  Surgeon: Michael Litter, MD;  Location: [facility];  Service: Gynecology;  Laterality: N/A;  . Cervical cerclage     Family History  Problem Relation Age of Onset  . Diabetes Mother   . Depression Mother   . Depression Sister   . Heart disease Maternal Grandfather   . Diabetes Paternal Grandmother   . Stroke Paternal Grandmother   . Heart disease Paternal Grandfather   . Hypertension Paternal Grandfather     History  Substance Use Topics  . Smoking status: Never Smoker   . Smokeless tobacco: Never Used  . Alcohol Use: Yes     occasionally but none with pregnancy    Allergies:  Allergies  Allergen Reactions  . Augmentin Nausea And Vomiting and Rash  . Ceclor (Cefaclor) Rash    Prescriptions prior to admission  Medication Sig Dispense Refill  . acetaminophen (TYLENOL) 500 MG tablet Take 500 mg by mouth daily as needed. For pain      . budesonide (PULMICORT) 180 MCG/ACT inhaler Inhale 2 puffs into the lungs every morning.      . levalbuterol (XOPENEX HFA) 45  MCG/ACT inhaler Inhale 1-2 puffs into the lungs every 6 (six) hours as needed. For shortness of breath/asthma/wheezing      . levalbuterol (XOPENEX) 1.25 MG/3ML nebulizer solution Take 1.25 mg by nebulization every 6 (six) hours as needed. For shortness of breath/wheezing      . NIFEdipine (PROCARDIA) 10 MG capsule Take 10 mg by mouth every 6 (six) hours as needed. For contractions      . OVER THE COUNTER MEDICATION Take 2 tablets by mouth at bedtime. Patient takes vitafusion prenatal gummies      . pantoprazole (PROTONIX) 40 MG tablet Take 40 mg by mouth at bedtime.      . progesterone 200 MG SUPP Place 200 mg vaginally every evening.         abd soft, gravid, nt, EGBUS WNL, SSE cervix with cerclage suture visible, no pooling seen, scant cloudy thin white discharge. Digital exam with cervix very thin fingertip. Feels vertex per ultrasound. BP 125/85  Temp(Src) 98.2 F (36.8 C) (Oral)  Resp 18 A 35 17 week IUP  with cerclage P amnisure pending. Report to S. Casidy Alberta, CNM Lavera Guise, CNM  Addendum: Amnisure neg Pt d/c'd home Labor precautions [facility]

## 2012-01-29 NOTE — Progress Notes (Signed)
Assessment discussed and amnisure pending with Dr. Pennie Rushing per telephone, continue with care. Lavera Guise, CNM

## 2012-01-30 ENCOUNTER — Inpatient Hospital Stay (HOSPITAL_COMMUNITY)
Admission: AD | Admit: 2012-01-30 | Discharge: 2012-01-30 | Disposition: A | Discharge status: Home / Self Care | Payer: Managed Care, Other (non HMO) | Source: Ambulatory Visit | Attending: Obstetrics and Gynecology | Admitting: Obstetrics and Gynecology

## 2012-01-30 ENCOUNTER — Encounter (HOSPITAL_COMMUNITY): Payer: Self-pay | Admitting: *Deleted

## 2012-01-30 DIAGNOSIS — O2390 Unspecified genitourinary tract infection in pregnancy, unspecified trimester: Secondary | ICD-10-CM

## 2012-01-30 DIAGNOSIS — J069 Acute upper respiratory infection, unspecified: Secondary | ICD-10-CM

## 2012-01-30 DIAGNOSIS — M7989 Other specified soft tissue disorders: Secondary | ICD-10-CM

## 2012-01-30 DIAGNOSIS — O99891 Other specified diseases and conditions complicating pregnancy: Secondary | ICD-10-CM | POA: Insufficient documentation

## 2012-01-30 DIAGNOSIS — J45909 Unspecified asthma, uncomplicated: Secondary | ICD-10-CM | POA: Insufficient documentation

## 2012-01-30 DIAGNOSIS — IMO0002 Reserved for concepts with insufficient information to code with codable children: Secondary | ICD-10-CM | POA: Insufficient documentation

## 2012-01-30 LAB — URINE MICROSCOPIC-ADD ON

## 2012-01-30 LAB — URINALYSIS, ROUTINE W REFLEX MICROSCOPIC
Bilirubin Urine: NEGATIVE
Nitrite: POSITIVE — AB
Specific Gravity, Urine: 1.025 (ref 1.005–1.030)
pH: 6 (ref 5.0–8.0)

## 2012-01-30 MED ORDER — NITROFURANTOIN MONOHYD MACRO 100 MG PO CAPS: 100.0000 mg | ORAL_CAPSULE | Freq: Two times a day (BID) | ORAL | Status: DC

## 2012-01-30 NOTE — Progress Notes (Signed)
Pt in c/o increased swelling in extremities throughout day today.  Denies any headaches, dizziness, or blurred vision.  Doesn't believe she's urinating as much as usual.  Denies any bleeding.  + FM.

## 2012-01-30 NOTE — Progress Notes (Addendum)
History    36 yo g5p2 35 27 week IUP presents with c/o of swelling to hands and legs, feet overnight denies ha, visual spots or blurring, with +FM, denies uc, srom, vag bleeding. No chief complaint on file.  @SFHPI @  OB History    Grav Para Term Preterm Abortions TAB SAB Ect Mult Living   5 2 0 2 2  2  1 2       Past Medical History  Diagnosis Date  . PONV (postoperative nausea and vomiting)   . Asthma     allergy induced asthma  . Reflux   . Headache     otc med - prn  . Anxiety     hx - no meds  . GERD (gastroesophageal reflux disease)     Past Surgical History  Procedure Date  . Lympth node     left - armpit  . Bone spurs     left and right feet  . Ankle surgery     left ankle infection - exploratory surg  . Breast enhancement surgery   . Replacement breast augmentation      x 2  . Nasal sinus surgery   . Cesarean section   . Dilation and curettage of uterus   . Cervical cerclage 09/10/2011    Procedure: CERCLAGE CERVICAL;  Surgeon: Michael Litter, MD;  Location: [facility];  Service: Gynecology;  Laterality: N/A;  . Cervical cerclage     Family History  Problem Relation Age of Onset  . Diabetes Mother   . Depression Mother   . Depression Sister   . Heart disease Maternal Grandfather   . Diabetes Paternal Grandmother   . Stroke Paternal Grandmother   . Heart disease Paternal Grandfather   . Hypertension Paternal Grandfather     History  Substance Use Topics  . Smoking status: Never Smoker   . Smokeless tobacco: Never Used  . Alcohol Use: Yes     occasionally but none with pregnancy    Allergies:  Allergies  Allergen Reactions  . Augmentin Nausea And Vomiting and Rash  . Ceclor (Cefaclor) Rash    Prescriptions prior to admission  Medication Sig Dispense Refill  . budesonide (PULMICORT) 180 MCG/ACT inhaler Inhale 3 puffs into the lungs every morning.       . budesonide (RHINOCORT AQUA) 32 MCG/ACT nasal spray Place 2 sprays into the nose daily.       . cetirizine (ZYRTEC) 10 MG tablet Take 10 mg by mouth daily.      . chlorpheniramine-HYDROcodone (TUSSIONEX) 10-8 MG/5ML LQCR Take 5 mLs by mouth every evening.      Marland Kitchen guaiFENesin (MUCINEX) 600 MG 12 hr tablet Take 600 mg by mouth 2 (two) times daily.      Marland Kitchen levalbuterol (XOPENEX HFA) 45 MCG/ACT inhaler Inhale 1-2 puffs into the lungs every 4 (four) hours. For shortness of breath/asthma/wheezing      . levalbuterol (XOPENEX) 1.25 MG/3ML nebulizer solution Take 1.25 mg by nebulization every 4 (four) hours. For shortness of breath/wheezing      . OVER THE COUNTER MEDICATION Take 2 tablets by mouth at bedtime. Patient takes vitafusion prenatal gummies      . pantoprazole (PROTONIX) 40 MG tablet Take 40 mg by mouth at bedtime.      Marland Kitchen PREDNISONE PO Take 2 tablets by mouth daily. 2 tablets for 4 days, then 1 tablet for 2 days. Pt was given at clinic and does not know strength.  Started 01/30/2012      .  progesterone 200 MG SUPP Place 200 mg vaginally every evening.       Marland Kitchen acetaminophen (TYLENOL) 500 MG tablet Take 500 mg by mouth daily as needed. For pain      . NIFEdipine (PROCARDIA) 10 MG capsule Take 10 mg by mouth every 6 (six) hours as needed. For contractions        @ROS @ Physical Exam  Frequent cough, lungs clear bilaterally, abd soft, gravid, nt, DTRS +1 bilaterally no clonus, +1 pitting to lower legs. BP 112/62  Pulse 99  Temp(Src) 98.8 F (37.1 C) (Oral)  Resp 22  Ht 5\' 6"  (1.676 m)  Wt 131.18 kg (289 lb 3.2 oz)  BMI 46.68 kg/m2 Blood pressure 112/62, pulse 99, temperature 98.8 F (37.1 C), temperature source Oral, resp. rate 22, height 5\' 6"  (1.676 m), weight 131.18 kg (289 lb 3.2 oz). A 35 27 week IUP     Asthma, on prednisone P ua pending, encouraged L side lying when resting, discharge home, f/o as scheduled. Collaboration with Dr. Estanislado Pandy per telephone. Lavera Guise, CNM   Addendum:  3/8 at 2000  Results for orders placed during the hospital encounter of 01/30/12 (from  the past 24 hour(s))  URINALYSIS, ROUTINE W REFLEX MICROSCOPIC     Status: Abnormal   Collection Time   01/30/12  5:45 PM      Component Value Range   Color, Urine YELLOW  YELLOW    APPearance CLEAR  CLEAR    Specific Gravity, Urine 1.025  1.005 - 1.030    pH 6.0  5.0 - 8.0    Glucose, UA NEGATIVE  NEGATIVE (mg/dL)   Hgb urine dipstick LARGE (*) NEGATIVE    Bilirubin Urine NEGATIVE  NEGATIVE    Ketones, ur NEGATIVE  NEGATIVE (mg/dL)   Protein, ur 30 (*) NEGATIVE (mg/dL)   Urobilinogen, UA 0.2  0.0 - 1.0 (mg/dL)   Nitrite POSITIVE (*) NEGATIVE    Leukocytes, UA MODERATE (*) NEGATIVE   URINE MICROSCOPIC-ADD ON     Status: Abnormal   Collection Time   01/30/12  5:45 PM      Component Value Range   Squamous Epithelial / LPF RARE  RARE    WBC, UA TOO NUMEROUS TO COUNT  <3 (WBC/hpf)   RBC / HPF 11-20  <3 (RBC/hpf)   Bacteria, UA FEW (*) RARE    Urine-Other       Value: MICROSCOPIC DONE ON UNSPUN URINE DUE TO LOW VOLUME SUBMITTED RESULTS MAY BE EFFECTED   Probable UTI  Rx for macrobid 100mg  BID x7days F/u office routine  S.Zenia Guest, CNM

## 2012-01-30 NOTE — Discharge Instructions (Signed)
Urinary Tract Infection in Pregnancy A urinary tract infection (UTI) is a bacterial infection of the urinary tract. Infection of the urinary tract can include the ureters, kidneys (pyelonephritis), bladder (cystitis), and urethra (urethritis). All pregnant women should be screened for bacteria in the urinary tract. Identifying and treating a UTI will decrease the risk of preterm labor and developing more serious infections in both the mother and baby. CAUSES Bacteria germs cause almost all UTIs. There are many factors that can increase your chances of getting a UTI during pregnancy. These include:  Having a short urethra.   Poor toilet and hygiene habits.   Sexual intercourse.   Blockage of urine along the urinary tract.   Problems with the pelvic muscles or nerves.   Diabetes.   Obesity.   Bladder problems after having several children.   Previous history of UTI.  SYMPTOMS   Pain, burning, or a stinging feeling when urinating.   Suddenly feeling the need to urinate right away (urgency).   Loss of bladder control (urinary incontinence).   Frequent urination, more than is common with pregnancy.   Lower abdominal or back discomfort.   Bad smelling urine.   Cloudy urine.   Blood in the urine (hematuria).   Fever.  When the kidneys are infected, the symptoms may be:  Back pain.   Flank pain on the right side more so than the left.   Fever.   Chills.   Nausea.   Vomiting.  DIAGNOSIS   Urine tests.   Additional tests and procedures may include:   Ultrasound of the kidneys, ureters, bladder, and urethra.   Looking in the bladder with a lighted tube (cystoscopy).   Certain X-ray studies only when absolutely necessary.  Finding out the results of your test Ask when your test results will be ready. Make sure you get your test results. TREATMENT  Antibiotic medicine by mouth.   Antibiotics given through the vein (intravenously), if needed.  HOME CARE  INSTRUCTIONS   Take your antibiotics as directed. Finish them even if you start to feel better. Only take medicine as directed by your caregiver.   Drink enough fluids to keep your urine clear or pale yellow.   Do not have sexual intercourse until the infection is gone and your caregiver says it is okay.   Make sure you are tested for UTIs throughout your pregnancy if you get one. These infections often come back.  Preventing a UTI in the future:  Practice good toilet habits. Always wipe from front to back. Use the tissue only once.   Do not hold your urine. Empty your bladder as soon as possible when the urge comes.   Do not douche or use deodorant sprays.   Wash with soap and warm water around the genital area and the anus.   Empty your bladder before and after sexual intercourse.   Wear underwear with a cotton crotch.   Avoid caffeine and carbonated drinks. They can irritate the bladder.   Drink cranberry juice or take cranberry pills. This may decrease the risk of getting a UTI.   Do not drink alcohol.   Keep all your appointments and tests as scheduled.  SEEK MEDICAL CARE IF:   Your symptoms get worse.   You are still having fevers 2 or more days after treatment begins.   You develop a rash.   You feel that you are having problems with medicines prescribed.   You develop abnormal vaginal discharge.  SEEK IMMEDIATE MEDICAL   CARE IF:   You develop back or flank pain.   You develop chills.   You have blood in your urine.   You develop nausea and vomiting.   You develop contractions of your uterus.   You have a gush of fluid from the vagina.  MAKE SURE YOU:   Understand these instructions.   Will watch your condition.   Will get help right away if you are not doing well or get worse.  Document Released: 03/07/2011 Document Revised: 10/30/2011 Document Reviewed: 03/07/2011 [employer] Patient Information 2012 [employer]. 

## 2012-01-31 ENCOUNTER — Inpatient Hospital Stay (HOSPITAL_COMMUNITY)
Admission: AD | Admit: 2012-01-31 | Discharge: 2012-01-31 | Disposition: A | Discharge status: Home Health | Payer: Managed Care, Other (non HMO) | Source: Ambulatory Visit | Attending: Obstetrics and Gynecology | Admitting: Obstetrics and Gynecology

## 2012-01-31 ENCOUNTER — Encounter (HOSPITAL_COMMUNITY): Payer: Self-pay

## 2012-01-31 DIAGNOSIS — J069 Acute upper respiratory infection, unspecified: Secondary | ICD-10-CM

## 2012-01-31 DIAGNOSIS — O368190 Decreased fetal movements, unspecified trimester, not applicable or unspecified: Secondary | ICD-10-CM | POA: Insufficient documentation

## 2012-01-31 LAB — CULTURE, BETA STREP (GROUP B ONLY)

## 2012-01-31 NOTE — Progress Notes (Signed)
No fetal movement for 2 hours decreased all day.

## 2012-01-31 NOTE — Progress Notes (Signed)
Patient is here with c/o no fetal movement for more than 3 hours. She states that she has had a month long asthma excerbation. She states that she is taking about 10 different medicines including steroids and not feeling better. She has a nonproductive cough and breaths shallow. She is not wheezing, diminshed bilateral breath sounds. She has 2+ bilateral lower extremity edema. She states that she has back pain from her uti(she is on antibiotic). She denies any lof or contractions. She states that she noticed brown vaginal discharge this morning. Denies recent intercourse

## 2012-02-01 NOTE — ED Provider Notes (Signed)
History     Chief Complaint  Patient presents with  . Decreased Fetal Movement   HPI Comments: Pt is a [id number] at [redacted]w[redacted]d with c/o decreased FM. Pt is crying states she feels really stressed out and agitated, has been coughing a lot today. Explained is likely side effect from steroids and the agitation will pass. Pt felt reassured after seeing FHR on EFM, feels movement now.       Past Medical History  Diagnosis Date  . PONV (postoperative nausea and vomiting)   . Asthma     allergy induced asthma  . Reflux   . Headache     otc med - prn  . Anxiety     hx - no meds  . GERD (gastroesophageal reflux disease)     Past Surgical History  Procedure Date  . Lympth node     left - armpit  . Bone spurs     left and right feet  . Ankle surgery     left ankle infection - exploratory surg  . Breast enhancement surgery   . Replacement breast augmentation      x 2  . Nasal sinus surgery   . Cesarean section   . Dilation and curettage of uterus   . Cervical cerclage 09/10/2011    Procedure: CERCLAGE CERVICAL;  Surgeon: Michael Litter, MD;  Location: [facility];  Service: Gynecology;  Laterality: N/A;  . Cervical cerclage     Family History  Problem Relation Age of Onset  . Diabetes Mother   . Depression Mother   . Depression Sister   . Heart disease Maternal Grandfather   . Diabetes Paternal Grandmother   . Stroke Paternal Grandmother   . Heart disease Paternal Grandfather   . Hypertension Paternal Grandfather     History  Substance Use Topics  . Smoking status: Never Smoker   . Smokeless tobacco: Never Used  . Alcohol Use: Yes     occasionally but none with pregnancy    Allergies:  Allergies  Allergen Reactions  . Augmentin Nausea And Vomiting and Rash  . Ceclor (Cefaclor) Rash    No prescriptions prior to admission    Review of Systems  HENT: Positive for congestion.   Respiratory: Positive for cough. Negative for shortness of breath and wheezing.     Psychiatric/Behavioral: The patient is nervous/anxious and has insomnia.   All other systems reviewed and are negative.   Physical Exam   Blood pressure 138/76, pulse 114, temperature 97.1 F (36.2 C), temperature source Oral, resp. rate 20, SpO2 100.00%.  Physical Exam  Nursing note and vitals reviewed. Constitutional: She is oriented to person, place, and time. She appears well-developed and well-nourished. She appears distressed.  HENT:  Head: Normocephalic.  Neck: Normal range of motion.  Cardiovascular: Normal rate and normal heart sounds.   Respiratory: Effort normal and breath sounds normal. No respiratory distress. She has no wheezes.  GI: Soft.  Genitourinary:       deferred  Musculoskeletal: Normal range of motion. She exhibits edema.  Neurological: She is alert and oriented to person, place, and time. She has normal reflexes.  Skin: Skin is warm and dry.  Psychiatric: She has a normal mood and affect. Her behavior is normal.   FHR 140 reactive CAT 1 toco quiet  MAU Course  Procedures    Assessment and Plan  IUP at 35wks FHR resasuring Agitation from steroids ambien RX called to [facility]   D/C home  FKC R/u routine  Rainier Feuerborn M 02/01/2012, 1:00 AM

## 2012-02-05 ENCOUNTER — Encounter (HOSPITAL_COMMUNITY): Payer: Self-pay | Admitting: *Deleted

## 2012-02-05 ENCOUNTER — Inpatient Hospital Stay (HOSPITAL_COMMUNITY)
Admission: AD | Admit: 2012-02-05 | Discharge: 2012-02-07 | DRG: 775 | Disposition: A | Discharge status: Home / Self Care | Payer: Managed Care, Other (non HMO) | Source: Ambulatory Visit | Attending: Obstetrics and Gynecology | Admitting: Obstetrics and Gynecology

## 2012-02-05 DIAGNOSIS — J45909 Unspecified asthma, uncomplicated: Secondary | ICD-10-CM | POA: Diagnosis present

## 2012-02-05 DIAGNOSIS — E669 Obesity, unspecified: Secondary | ICD-10-CM | POA: Diagnosis present

## 2012-02-05 DIAGNOSIS — O34219 Maternal care for unspecified type scar from previous cesarean delivery: Secondary | ICD-10-CM | POA: Diagnosis present

## 2012-02-05 DIAGNOSIS — O09529 Supervision of elderly multigravida, unspecified trimester: Secondary | ICD-10-CM | POA: Diagnosis present

## 2012-02-05 DIAGNOSIS — D649 Anemia, unspecified: Secondary | ICD-10-CM | POA: Diagnosis not present

## 2012-02-05 DIAGNOSIS — O9903 Anemia complicating the puerperium: Secondary | ICD-10-CM | POA: Diagnosis not present

## 2012-02-05 DIAGNOSIS — Z22330 Carrier of Group B streptococcus: Secondary | ICD-10-CM

## 2012-02-05 DIAGNOSIS — O3430 Maternal care for cervical incompetence, unspecified trimester: Secondary | ICD-10-CM | POA: Diagnosis present

## 2012-02-05 DIAGNOSIS — IMO0002 Reserved for concepts with insufficient information to code with codable children: Secondary | ICD-10-CM | POA: Diagnosis not present

## 2012-02-05 DIAGNOSIS — G43909 Migraine, unspecified, not intractable, without status migrainosus: Secondary | ICD-10-CM | POA: Diagnosis not present

## 2012-02-05 DIAGNOSIS — O99892 Other specified diseases and conditions complicating childbirth: Secondary | ICD-10-CM | POA: Diagnosis present

## 2012-02-05 DIAGNOSIS — Z98891 History of uterine scar from previous surgery: Secondary | ICD-10-CM

## 2012-02-05 DIAGNOSIS — J069 Acute upper respiratory infection, unspecified: Secondary | ICD-10-CM

## 2012-02-05 DIAGNOSIS — O328XX0 Maternal care for other malpresentation of fetus, not applicable or unspecified: Secondary | ICD-10-CM | POA: Diagnosis present

## 2012-02-05 DIAGNOSIS — O09899 Supervision of other high risk pregnancies, unspecified trimester: Secondary | ICD-10-CM

## 2012-02-05 DIAGNOSIS — F419 Anxiety disorder, unspecified: Secondary | ICD-10-CM | POA: Diagnosis present

## 2012-02-05 HISTORY — DX: Other seasonal allergic rhinitis: J30.2

## 2012-02-05 HISTORY — DX: Carpal tunnel syndrome, unspecified upper limb: G56.00

## 2012-02-05 LAB — CBC
HCT: 35.1 % — ABNORMAL LOW (ref 36.0–46.0)
MCHC: 31.1 g/dL (ref 30.0–36.0)
Platelets: 290 10*3/uL (ref 150–400)
RDW: 15.2 % (ref 11.5–15.5)
WBC: 12.6 10*3/uL — ABNORMAL HIGH (ref 4.0–10.5)

## 2012-02-05 LAB — RPR: RPR Ser Ql: NONREACTIVE

## 2012-02-05 MED ORDER — OXYTOCIN 20 UNITS IN LACTATED RINGERS INFUSION - SIMPLE
1.0000 m[IU]/min | INTRAVENOUS | Status: DC
  Administered 2012-02-05: 1 m[IU]/min via INTRAVENOUS
  Filled 2012-02-05: qty 1000

## 2012-02-05 MED ORDER — IBUPROFEN 600 MG PO TABS
600.0000 mg | ORAL_TABLET | Freq: Four times a day (QID) | ORAL | Status: DC | PRN
  Administered 2012-02-05: 600 mg via ORAL
  Filled 2012-02-05: qty 1

## 2012-02-05 MED ORDER — BUTORPHANOL TARTRATE 2 MG/ML IJ SOLN
1.0000 mg | INTRAMUSCULAR | Status: DC | PRN
  Administered 2012-02-05: 1 mg via INTRAVENOUS

## 2012-02-05 MED ORDER — LACTATED RINGERS IV SOLN: 500.0000 mL | INTRAVENOUS | Status: DC | PRN

## 2012-02-05 MED ORDER — FENTANYL CITRATE 0.05 MG/ML IJ SOLN: 100.0000 ug | Freq: Once | INTRAMUSCULAR | Status: DC

## 2012-02-05 MED ORDER — FENTANYL CITRATE 0.05 MG/ML IJ SOLN
INTRAMUSCULAR | Status: AC
  Administered 2012-02-05: 100 ug
  Filled 2012-02-05: qty 2

## 2012-02-05 MED ORDER — OXYTOCIN 20 UNITS IN LACTATED RINGERS INFUSION - SIMPLE: 125.0000 mL/h | Freq: Once | INTRAVENOUS | Status: DC

## 2012-02-05 MED ORDER — LIDOCAINE HCL (PF) 1 % IJ SOLN
30.0000 mL | INTRAMUSCULAR | Status: DC | PRN
  Administered 2012-02-05: 30 mL via SUBCUTANEOUS
  Filled 2012-02-05: qty 30

## 2012-02-05 MED ORDER — FENTANYL 2.5 MCG/ML BUPIVACAINE 1/10 % EPIDURAL INFUSION (WH - ANES): 14.0000 mL/h | INTRAMUSCULAR | Status: DC

## 2012-02-05 MED ORDER — PHENYLEPHRINE 40 MCG/ML (10ML) SYRINGE FOR IV PUSH (FOR BLOOD PRESSURE SUPPORT): 80.0000 ug | PREFILLED_SYRINGE | INTRAVENOUS | Status: DC | PRN

## 2012-02-05 MED ORDER — LACTATED RINGERS IV SOLN
INTRAVENOUS | Status: DC
  Administered 2012-02-05 (×2): via INTRAVENOUS

## 2012-02-05 MED ORDER — ONDANSETRON HCL 4 MG/2ML IJ SOLN
4.0000 mg | Freq: Four times a day (QID) | INTRAMUSCULAR | Status: DC | PRN
  Administered 2012-02-05: 4 mg via INTRAVENOUS
  Filled 2012-02-05: qty 2

## 2012-02-05 MED ORDER — EPHEDRINE 5 MG/ML INJ: 10.0000 mg | INTRAVENOUS | Status: DC | PRN

## 2012-02-05 MED ORDER — OXYTOCIN 10 UNIT/ML IJ SOLN: 10.0000 [IU] | Freq: Once | INTRAMUSCULAR | Status: DC

## 2012-02-05 MED ORDER — BUTORPHANOL TARTRATE 2 MG/ML IJ SOLN
INTRAMUSCULAR | Status: AC
  Administered 2012-02-05: 1 mg via INTRAVENOUS
  Filled 2012-02-05: qty 1

## 2012-02-05 MED ORDER — TERBUTALINE SULFATE 1 MG/ML IJ SOLN: 0.2500 mg | Freq: Once | INTRAMUSCULAR | Status: AC | PRN

## 2012-02-05 MED ORDER — ALBUTEROL SULFATE (5 MG/ML) 0.5% IN NEBU
2.5000 mg | INHALATION_SOLUTION | Freq: Four times a day (QID) | RESPIRATORY_TRACT | Status: DC
  Filled 2012-02-05 (×3): qty 0.5

## 2012-02-05 MED ORDER — OXYCODONE-ACETAMINOPHEN 5-325 MG PO TABS: 1.0000 | ORAL_TABLET | ORAL | Status: DC | PRN

## 2012-02-05 MED ORDER — FLUTICASONE PROPIONATE HFA 44 MCG/ACT IN AERO
2.0000 | INHALATION_SPRAY | Freq: Two times a day (BID) | RESPIRATORY_TRACT | Status: DC
  Administered 2012-02-05: 2 via RESPIRATORY_TRACT
  Filled 2012-02-05: qty 10.6

## 2012-02-05 MED ORDER — FLUTICASONE PROPIONATE 50 MCG/ACT NA SUSP
1.0000 | Freq: Every day | NASAL | Status: DC
  Administered 2012-02-05: 1 via NASAL
  Filled 2012-02-05: qty 16

## 2012-02-05 MED ORDER — ACETAMINOPHEN 325 MG PO TABS: 650.0000 mg | ORAL_TABLET | ORAL | Status: DC | PRN

## 2012-02-05 MED ORDER — OXYTOCIN BOLUS FROM INFUSION
500.0000 mL | Freq: Once | INTRAVENOUS | Status: AC
  Administered 2012-02-05: 500 mL via INTRAVENOUS
  Filled 2012-02-05: qty 500

## 2012-02-05 MED ORDER — LORATADINE 10 MG PO TABS
10.0000 mg | ORAL_TABLET | Freq: Every day | ORAL | Status: DC
  Administered 2012-02-05: 10 mg via ORAL
  Filled 2012-02-05 (×2): qty 1

## 2012-02-05 MED ORDER — CITRIC ACID-SODIUM CITRATE 334-500 MG/5ML PO SOLN: 30.0000 mL | ORAL | Status: DC | PRN

## 2012-02-05 MED ORDER — DIPHENHYDRAMINE HCL 50 MG/ML IJ SOLN: 12.5000 mg | INTRAMUSCULAR | Status: DC | PRN

## 2012-02-05 MED ORDER — LACTATED RINGERS IV SOLN: 500.0000 mL | Freq: Once | INTRAVENOUS | Status: DC

## 2012-02-05 NOTE — H&P (Signed)
Gina Schneider is a 36 y.o. female presenting for SROM at 1300 with light MSAF, denies VB, reports decreased FM ("haven't felt baby much today") Pt is recovering from URI/asthma exacerbation, states she feels so much better than last week. Cerclage removed last week. Reports occ ctx.  Pregnancy significant for:  1. Obesity 2. Asthma 3. Hx PTD with cerclage this pregnancy 4. unk LMP - recent sab, dating per 1st trimester [place] 5. AMA - genetic screens nl 6. Anxiety 7. Hx C/S desires TOLAC 8. GERD 9. 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 Hx SAB x2 12. Breast augmentation 13. Migraines 14. Hx PTL - with +FFN, rcv'd BMZ, started on 17p but had reaction, switched to vag progesterone 15. Elevated 1hr gtt - nl 3hr  HPI: pt began prenatal care at [facility] at 7wks. Pt had unk LMP and dating [place] =EDC 03/03/12. Cerclage was by Dr Normand Sloop at 14wks. Pt was started on 17p injections but had reaction at injection site and declined to continue injections, she was then switched to progesterone vag suppository. She was followed with serial [place] for cx length, shortening and dynamic cx was noted. She had a +FFN at 24wks and rcv'd BMZ course, declined IP monitoring and was on bedrest at home. She had several episodes of asthma exacerbation and URI tx'd with nebulizer and most recently prednisone. She had normal 1st trimester screen and AFP. Anatomy US was normal. [place] for EFW at 34wks was 90% and 6#8oz, with normal AFI at 35wks. GBS was neg at 35wks.   Maternal Medical History:  Reason for admission: Reason for admission: rupture of membranes.  Contractions: Frequency: irregular.    Fetal activity: Perceived fetal activity is normal.   Last perceived fetal movement was within the past hour.    Prenatal complications: Preterm labor.     OB History    Grav Para Term Preterm Abortions TAB SAB Ect Mult Living   5 2 0 2 2  2  1 2     2/05 SVD at [redacted]w[redacted]d 6#5oz 2/08 C/S breach/PPROM at 33wks 6#6oz 12/11 blighted ovum 4/12 twin,  missed SAB D&C    Past Medical History  Diagnosis Date  . PONV (postoperative nausea and vomiting)   . Asthma     allergy induced asthma  . Reflux   . Headache     otc med - prn  . Anxiety     hx - no meds  . GERD (gastroesophageal reflux disease)   . Seasonal allergies   . Carpal tunnel syndrome    Past Surgical History  Procedure Date  . Lympth node     left - armpit  . Bone spurs     left and right feet  . Ankle surgery     left ankle infection - exploratory surg  . Breast enhancement surgery   . Replacement breast augmentation      x 2  . Nasal sinus surgery   . Cesarean section   . Dilation and curettage of uterus   . Cervical cerclage 09/10/2011    Procedure: CERCLAGE CERVICAL;  Surgeon: Michael Litter, MD;  Location: [facility];  Service: Gynecology;  Laterality: N/A;  . Cervical cerclage    Family History: family history includes Depression in her mother and sister; Diabetes in her mother and paternal grandmother; Heart disease in her maternal grandfather and paternal grandfather; Hypertension in her paternal grandfather; and Stroke in her paternal grandmother. Social History:  reports that she has never smoked. She has never  used smokeless tobacco. She reports that she drinks alcohol. She reports that she does not use illicit drugs.  Review of Systems  HENT: Positive for congestion.   Respiratory: Positive for cough.   All other systems reviewed and are negative.    Dilation: 2 Effacement (%): 80 Station: -1 Exam by:: S. Anala Whisenant CNM Blood pressure 119/46, pulse 105, temperature 98.6 F (37 C), temperature source Oral, resp. rate 20, height 5\' 7"  (1.702 m), weight 129.275 kg (285 lb), SpO2 99.00%. Maternal Exam:  Uterine Assessment: Contraction frequency is irregular.   Abdomen: Patient reports no abdominal tenderness. Surgical scars: low transverse.   Fundal height is aga.   Estimated fetal weight is 6#.   Fetal presentation: vertex  Introitus: Normal  vulva. Vagina is positive for vaginal discharge.  Ferning test: not done.  Amniotic fluid character: meconium stained. Grossly SROM with light MSAF  Pelvis: adequate for delivery.   Cervix: Cervix evaluated by digital exam.     Fetal Exam Fetal Monitor Review: Mode: ultrasound.   Baseline rate: 140.  Variability: moderate (6-25 bpm).   Pattern: accelerations present and no decelerations.    Fetal State Assessment: Category I - tracings are normal.     Physical Exam  Nursing note and vitals reviewed. Constitutional: She is oriented to person, place, and time. She appears well-developed and well-nourished. No distress.  HENT:  Head: Normocephalic.  Neck: Normal range of motion.  Cardiovascular: Regular rhythm and normal heart sounds.        Slightly tachycardic  Respiratory: Effort normal and breath sounds normal. No respiratory distress. She has no wheezes.  GI: Soft.       Gravid, obese  Genitourinary: Vaginal discharge found.       MSAF  Musculoskeletal: Normal range of motion. She exhibits edema.       Mild bilateral LE  Neurological: She is alert and oriented to person, place, and time.  Skin: Skin is warm and dry.  Psychiatric: She has a normal mood and affect. Her behavior is normal.    Prenatal labs: ABO, Rh: --/--/O POS (04/27 1202) Antibody: Negative (08/10 0000) Rubella: Immune (08/10 0000) RPR: Nonreactive (08/10 0000)  HBsAg: Negative (08/10 0000)  HIV: Non-reactive (08/10 0000)  GBS: Negative (03/06 0000)  Pap - nl 1st trimester screen neg AFP neg 1hr gtt nl   Assessment/Plan: IUP at [redacted]w[redacted]d SROM - no sx labor Hx C/S desires TOLAC GBS neg FHR reassuring  Admit to birthing suites - Dr Su Hilt attending Routine CNM orders Consider pitocin aug if no labor in 4-6 hr   Tadeusz Stahl M 02/05/2012, 4:05 PM

## 2012-02-05 NOTE — Progress Notes (Signed)
  Subjective: Feeling more pressure in rectum.  Objective: BP 121/93  Pulse 118  Temp(Src) 98.6 F (37 C) (Oral)  Resp 20  Ht 5\' 7"  (1.702 m)  Wt 285 lb (129.275 kg)  BMI 44.64 kg/m2  SpO2 99%  Breastfeeding? Unknown      FHT:  Reassuring, occasionally difficult to trace with patient sitting straight up in bed due to back pain. UC:   q 2-3 min, moderate. SVE:   Dilation: 9 Effacement (%): 100 Station: -1 Exam by:: v.lahtam, cnm Cervix does have some scar tissue, stretched it with last exam.  Pitocin remains at 3 mu/min  Assessment / Plan: Await increased pressure/cervical change.  Gina Schneider 02/05/2012, 7:57 PM

## 2012-02-05 NOTE — MAU Note (Signed)
Wad of green mucous noted when wiped., ?leaking- unsure if urine.

## 2012-02-05 NOTE — Progress Notes (Signed)
  Subjective: Feeling more pain with contractions--requests VE.  Declines pain medication at present.  Objective: BP 121/93  Pulse 118  Temp(Src) 98.6 F (37 C) (Oral)  Resp 20  Ht 5\' 7"  (1.702 m)  Wt 285 lb (129.275 kg)  BMI 44.64 kg/m2  SpO2 99%  Breastfeeding? Unknown      FHT: Category 1 UC:   q 2-3 min. SVE:   Cx 5 cm, 100%, vtx, -1  Pitocin on 3 mu/min  Labs: Lab Results  Component Value Date   WBC 12.6* 02/05/2012   HGB 10.9* 02/05/2012   HCT 35.1* 02/05/2012   MCV 77.1* 02/05/2012   PLT 290 02/05/2012    Assessment / Plan: Active labor 36 1/7 wks, s/p SROM at 1pm  Will continue to observe.   Nigel Bridgeman 02/05/2012, 7:54 PM

## 2012-02-05 NOTE — Progress Notes (Signed)
Patient ID: Gina Schneider, female   DOB: 01/06/1976, 36 y.o.   MRN: [id number] .Subjective: Denies pain, family at bs now, c/o cough used flovent   Objective: BP 119/46  Pulse 105  Temp(Src) 98.6 F (37 C) (Oral)  Resp 22  Ht 5\' 7"  (1.702 m)  Wt 129.275 kg (285 lb)  BMI 44.64 kg/m2  SpO2 99%  Breastfeeding? Unknown   FHT:  FHR: 140 bpm, variability: moderate,  accelerations:  Present,  decelerations:  Absent UC:   irregular, every 3-4 minutes, not tracing well SVE:   Dilation: 3 Effacement (%): 100 Station: -1 Exam by:: Sanda Klein, cnm    Assessment / Plan: SROM GBS neg Will start pitocin aug   Fetal Wellbeing:  Category I Pain Control:  Labor support without medications  Update physician PRN  Tanvi Gatling M 02/05/2012, 5:03 PM

## 2012-02-06 ENCOUNTER — Other Ambulatory Visit: Payer: Managed Care, Other (non HMO)

## 2012-02-06 ENCOUNTER — Encounter: Payer: Managed Care, Other (non HMO) | Admitting: Registered Nurse

## 2012-02-06 ENCOUNTER — Encounter (HOSPITAL_COMMUNITY): Payer: Self-pay | Admitting: *Deleted

## 2012-02-06 MED ORDER — DIBUCAINE 1 % RE OINT: 1.0000 "application " | TOPICAL_OINTMENT | RECTAL | Status: DC | PRN

## 2012-02-06 MED ORDER — PRENATAL MULTIVITAMIN CH
1.0000 | ORAL_TABLET | Freq: Every day | ORAL | Status: DC
  Administered 2012-02-06 – 2012-02-07 (×2): 1 via ORAL
  Filled 2012-02-06 (×2): qty 1

## 2012-02-06 MED ORDER — SIMETHICONE 80 MG PO CHEW
80.0000 mg | CHEWABLE_TABLET | ORAL | Status: DC | PRN
  Administered 2012-02-06: 80 mg via ORAL

## 2012-02-06 MED ORDER — TETANUS-DIPHTH-ACELL PERTUSSIS 5-2.5-18.5 LF-MCG/0.5 IM SUSP
0.5000 mL | Freq: Once | INTRAMUSCULAR | Status: AC
  Administered 2012-02-06: 0.5 mL via INTRAMUSCULAR
  Filled 2012-02-06: qty 0.5

## 2012-02-06 MED ORDER — SENNOSIDES-DOCUSATE SODIUM 8.6-50 MG PO TABS
2.0000 | ORAL_TABLET | Freq: Every day | ORAL | Status: DC
  Administered 2012-02-06: 2 via ORAL

## 2012-02-06 MED ORDER — WITCH HAZEL-GLYCERIN EX PADS: 1.0000 "application " | MEDICATED_PAD | CUTANEOUS | Status: DC | PRN

## 2012-02-06 MED ORDER — ONDANSETRON HCL 4 MG PO TABS
4.0000 mg | ORAL_TABLET | ORAL | Status: DC | PRN
Start: 2012-02-06 — End: 2012-02-07

## 2012-02-06 MED ORDER — ZOLPIDEM TARTRATE 5 MG PO TABS: 5.0000 mg | ORAL_TABLET | Freq: Every evening | ORAL | Status: DC | PRN

## 2012-02-06 MED ORDER — LEVALBUTEROL TARTRATE 45 MCG/ACT IN AERO
1.0000 | INHALATION_SPRAY | RESPIRATORY_TRACT | Status: DC
  Filled 2012-02-06: qty 15

## 2012-02-06 MED ORDER — ONDANSETRON HCL 4 MG/2ML IJ SOLN: 4.0000 mg | INTRAMUSCULAR | Status: DC | PRN

## 2012-02-06 MED ORDER — OXYCODONE-ACETAMINOPHEN 5-325 MG PO TABS: 1.0000 | ORAL_TABLET | ORAL | Status: DC | PRN

## 2012-02-06 MED ORDER — LANOLIN HYDROUS EX OINT: TOPICAL_OINTMENT | CUTANEOUS | Status: DC | PRN

## 2012-02-06 MED ORDER — BENZOCAINE-MENTHOL 20-0.5 % EX AERO: 1.0000 "application " | INHALATION_SPRAY | CUTANEOUS | Status: DC | PRN

## 2012-02-06 MED ORDER — PANTOPRAZOLE SODIUM 40 MG PO TBEC
40.0000 mg | DELAYED_RELEASE_TABLET | Freq: Every day | ORAL | Status: DC
  Administered 2012-02-06 – 2012-02-07 (×2): 40 mg via ORAL
  Filled 2012-02-06 (×3): qty 1

## 2012-02-06 MED ORDER — IBUPROFEN 600 MG PO TABS
600.0000 mg | ORAL_TABLET | Freq: Four times a day (QID) | ORAL | Status: DC
  Administered 2012-02-06 – 2012-02-07 (×7): 600 mg via ORAL
  Filled 2012-02-06 (×7): qty 1

## 2012-02-06 MED ORDER — LEVALBUTEROL TARTRATE 45 MCG/ACT IN AERO
1.0000 | INHALATION_SPRAY | RESPIRATORY_TRACT | Status: DC | PRN
  Filled 2012-02-06: qty 15

## 2012-02-06 MED ORDER — NITROFURANTOIN MONOHYD MACRO 100 MG PO CAPS
100.0000 mg | ORAL_CAPSULE | Freq: Two times a day (BID) | ORAL | Status: DC
  Administered 2012-02-06 – 2012-02-07 (×4): 100 mg via ORAL
  Filled 2012-02-06 (×6): qty 1

## 2012-02-06 MED ORDER — DIPHENHYDRAMINE HCL 25 MG PO CAPS: 25.0000 mg | ORAL_CAPSULE | Freq: Four times a day (QID) | ORAL | Status: DC | PRN

## 2012-02-06 NOTE — Progress Notes (Signed)
S comfortable, little bleeding, no wheezing, breast well, no burning with urination, plans condoms O VSS     Lungs clear bilaterally, frequent dry cough, AP RRR     Ff sm rubra flow 2 degree perineal lac well approximated no redness, edema or drainage      -Homan's sign bilaterally, +1 pitting edem to lower legs Results for orders placed during the hospital encounter of 02/05/12 (from the past 48 hour(s))  CBC     Status: Abnormal   Collection Time   02/05/12  3:56 PM      Component Value Range Comment   WBC 12.6 (*) 4.0 - 10.5 (K/uL)    RBC 4.55  3.87 - 5.11 (MIL/uL)    Hemoglobin 10.9 (*) 12.0 - 15.0 (g/dL)    HCT 25.3 (*) 66.4 - 46.0 (%)    MCV 77.1 (*) 78.0 - 100.0 (fL)    MCH 24.0 (*) 26.0 - 34.0 (pg)    MCHC 31.1  30.0 - 36.0 (g/dL)    RDW 40.3  47.4 - 25.9 (%)    Platelets 290  150 - 400 (K/uL)   RPR     Status: Normal   Collection Time   02/05/12  3:56 PM      Component Value Range Comment   RPR NON REACTIVE  NON REACTIVE    A PP day      Asthma P continue care, home 3/16 Lavera Guise, [place]

## 2012-02-07 ENCOUNTER — Encounter (HOSPITAL_COMMUNITY)
Admission: RE | Admit: 2012-02-07 | Discharge: 2012-02-07 | Disposition: A | Discharge status: Home / Self Care | Payer: Managed Care, Other (non HMO) | Source: Ambulatory Visit | Attending: Obstetrics and Gynecology | Admitting: Obstetrics and Gynecology

## 2012-02-07 DIAGNOSIS — IMO0002 Reserved for concepts with insufficient information to code with codable children: Secondary | ICD-10-CM | POA: Diagnosis not present

## 2012-02-07 DIAGNOSIS — O923 Agalactia: Secondary | ICD-10-CM | POA: Insufficient documentation

## 2012-02-07 LAB — CBC
MCV: 78.4 fL (ref 78.0–100.0)
Platelets: 228 10*3/uL (ref 150–400)
RBC: 3.66 MIL/uL — ABNORMAL LOW (ref 3.87–5.11)
WBC: 10.7 10*3/uL — ABNORMAL HIGH (ref 4.0–10.5)

## 2012-02-07 MED ORDER — IBUPROFEN 600 MG PO TABS: 600.0000 mg | ORAL_TABLET | Freq: Four times a day (QID) | ORAL | Status: AC

## 2012-02-07 MED ORDER — SERTRALINE HCL 25 MG PO TABS: 25.0000 mg | ORAL_TABLET | Freq: Every day | ORAL | Status: DC

## 2012-02-07 MED ORDER — POLYSACCHARIDE IRON COMPLEX 150 MG PO CAPS: 150.0000 mg | ORAL_CAPSULE | Freq: Every day | ORAL | Status: DC

## 2012-02-07 MED ORDER — POLYSACCHARIDE IRON COMPLEX 150 MG PO CAPS
150.0000 mg | ORAL_CAPSULE | Freq: Every day | ORAL | Status: DC
  Administered 2012-02-07: 150 mg via ORAL
  Filled 2012-02-07 (×2): qty 1

## 2012-02-07 MED ORDER — FLUTICASONE PROPIONATE 50 MCG/ACT NA SUSP
2.0000 | Freq: Every day | NASAL | Status: DC
  Administered 2012-02-07: 2 via NASAL
  Filled 2012-02-07: qty 16

## 2012-02-07 MED ORDER — LORATADINE 10 MG PO TABS: 10.0000 mg | ORAL_TABLET | Freq: Every day | ORAL | Status: DC

## 2012-02-07 MED ORDER — LORATADINE 10 MG PO TABS
10.0000 mg | ORAL_TABLET | Freq: Every day | ORAL | Status: DC
  Administered 2012-02-07: 10 mg via ORAL
  Filled 2012-02-07 (×2): qty 1

## 2012-02-07 NOTE — Progress Notes (Signed)
PSYCHOSOCIAL ASSESSMENT ~ MATERNAL/CHILD Name:  "Gina Schneider"        Age: 36 days    Referral Date: 02/05/2012   Reason/Source:Hx of postpartum anxiety/CN I. FAMILY/HOME ENVIRONMENT A. Child's Legal Guardian Parent(s)     Name: Alycea Segoviano  DOB: [birth year]    Age: 80 Address: [address] [place], [place] [address] Name: Anjelita Sheahan Address: same B. Other Household Members/Support Persons Name: Liam-40 year old brother          Name: Charley-71.46 year old sister               C.   Other Support:  Maternal best friend, maternal grandmother II. PSYCHOSOCIAL DATA A. Information Source X Patient Interview   B. 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 Cultural and Environment Information/Cultural Issues Impacting Care:  N/A III. STRENGTHS X Supportive family/friends   X Adequate Resources  X Compliance with medical plan  X Home prepared for Child (including basic supplies)                 X Understanding of illness            IV. RISK FACTORS AND CURRENT PROBLEMS        X Mental Illness- History of Postpartum Anxiety              V. SOCIAL WORK ASSESSMENT Met with MOB and baby at bedside.  MOB reports history of postpartum anxiety with past two births due to babies having been born premature and spending significant time in NICU.  MOB reported how she felt an intense loss of control and exacerbation of stress due to not being able to go home with babies.  MOB reports a much better postpartum experience so far due to markedly different birth experiences.  MOB was able to advocate for and successfully complete a VBAC.  Her baby was born on time and did not require NICU admission.  Baby is currently using a Phototherapy blanket, and MOB is handling the situation well.  MOB and baby are working on breastfeeding.  MOB acknowledges being sleep deprived, but feels once she is home and settled  in, things will get better.   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  Her husband has been at the same place of employment for 15 years and is enjoying a 2 week leave under FMLA.  MOB reports being able to advocate well for herself this time around and her mood and affect were reflective of her increased confidence and coping.  Maternal grandmother is slated to help out with the older kids and MOB and baby settle into breastfeeding and their new routine.  MOB also has a sitter lined up to help out with the older kids for a while with homework, activities and housework at needed to support MOB's recovery.  MOB has a best friend who is a single mom to 15 and 57 year old children.  She has been by to visit MOB.  MOB reports she will have FOB post on MOB Facebook page that are resting and settling into their new routine and they will inform others of when they will entertain guests.  MOB is proud of her ability to set that boundary for self care needs.  MOB has spoken to her OB provider and  her clinician is prepared to call in a prescription for MOB for medication to manage mood when MOB is ready to resume treatment.  Commended MOB for preparing herself, family and medical team to support her recovery. MOB and baby are well bonded and enjoying the postpartum experience.   VI. SOCIAL WORK PLAN X No Further Intervention Required/No Barriers to Discharge  Staci Acosta, MSW, LCSW, 02/07/2012, 11:18 am

## 2012-02-07 NOTE — Discharge Instructions (Addendum)
Breastfeeding BENEFITS OF BREASTFEEDING For the baby  The first milk (colostrum) helps the baby's digestive system function better.   There are antibodies from the mother in the milk that help the baby fight off infections.   The baby has a lower incidence of asthma, allergies, and SIDS (sudden infant death syndrome).   The nutrients in breast milk are better than formulas for the baby and helps the baby's brain grow better.   Babies who breastfeed have less gas, colic, and constipation.  For the mother  Breastfeeding helps develop a very special bond between mother and baby.   It is more convenient, always available at the correct temperature and cheaper than formula feeding.   It burns calories in the mother and helps with losing weight that was gained during pregnancy.   It makes the uterus contract back down to normal size faster and slows bleeding following delivery.   Breastfeeding mothers have a lower risk of developing breast cancer.  NURSE FREQUENTLY  A healthy, full-term baby may breastfeed as often as every hour or space his or her feedings to every 3 hours.   How often to nurse will vary from baby to baby. Watch your baby for signs of hunger, not the clock.   Nurse as often as the baby requests, or when you feel the need to reduce the fullness of your breasts.   Awaken the baby if it has been 3 to 4 hours since the last feeding.   Frequent feeding will help the mother make more milk and will prevent problems like sore nipples and engorgement of the breasts.  BABY'S POSITION AT THE BREAST  Whether lying down or sitting, be sure that the baby's tummy is facing your tummy.   Support the breast with 4 fingers underneath the breast and the thumb above. Make sure your fingers are well away from the nipple and baby's mouth.   Stroke the baby's lips and cheek closest to the breast gently with your finger or nipple.   When the baby's mouth is open wide enough, place all  of your nipple and as much of the dark area around the nipple as possible into your baby's mouth.   Pull the baby in close so the tip of the nose and the baby's cheeks touch the breast during the feeding.  FEEDINGS  The length of each feeding varies from baby to baby and from feeding to feeding.   The baby must suck about 2 to 3 minutes for your milk to get to him or her. This is called a "let down." For this reason, allow the baby to feed on each breast as long as he or she wants. Your baby will end the feeding when he or she has received the right balance of nutrients.   To break the suction, put your finger into the corner of the baby's mouth and slide it between his or her gums before removing your breast from his or her mouth. This will help prevent sore nipples.  REDUCING BREAST ENGORGEMENT  In the first week after your baby is born, you may experience signs of breast engorgement. When breasts are engorged, they feel heavy, warm, full, and may be tender to the touch. You can reduce engorgement if you:   Nurse frequently, every 2 to 3 hours. Mothers who breastfeed early and often have fewer problems with engorgement.   Place light ice packs on your breasts between feedings. This reduces swelling. Wrap the ice packs in a   lightweight towel to protect your skin.   Apply moist hot packs to your breast for 5 to 10 minutes before each feeding. This increases circulation and helps the milk flow.   Gently massage your breast before and during the feeding.   Make sure that the baby empties at least one breast at every feeding before switching sides.   Use a breast pump to empty the breasts if your baby is sleepy or not nursing well. You may also want to pump if you are returning to work or or you feel you are getting engorged.   Avoid bottle feeds, pacifiers or supplemental feedings of water or juice in place of breastfeeding.   Be sure the baby is latched on and positioned properly while  breastfeeding.   Prevent fatigue, stress, and anemia.   Wear a supportive bra, avoiding underwire styles.   Eat a balanced diet with enough fluids.  If you follow these suggestions, your engorgement should improve in 24 to 48 hours. If you are still experiencing difficulty, call your lactation consultant or caregiver. IS MY BABY GETTING ENOUGH MILK? Sometimes, mothers worry about whether their babies are getting enough milk. You can be assured that your baby is getting enough milk if:  The baby is actively sucking and you hear swallowing.   The baby nurses at least 8 to 12 times in a 24 hour time period. Nurse your baby until he or she unlatches or falls asleep at the first breast (at least 10 to 20 minutes), then offer the second side.   The baby is wetting 5 to 6 disposable diapers (6 to 8 cloth diapers) in a 24 hour period by 5 to 6 days of age.   The baby is having at least 2 to 3 stools every 24 hours for the first few months. Breast milk is all the food your baby needs. It is not necessary for your baby to have water or formula. In fact, to help your breasts make more milk, it is best not to give your baby supplemental feedings during the early weeks.   The stool should be soft and yellow.   The baby should gain 4 to 7 ounces per week after he is 4 days old.  TAKE CARE OF YOURSELF Take care of your breasts by:  Bathing or showering daily.   Avoiding the use of soaps on your nipples.   Start feedings on your left breast at one feeding and on your right breast at the next feeding.   You will notice an increase in your milk supply 2 to 5 days after delivery. You may feel some discomfort from engorgement, which makes your breasts very firm and often tender. Engorgement "peaks" out within 24 to 48 hours. In the meantime, apply warm moist towels to your breasts for 5 to 10 minutes before feeding. Gentle massage and expression of some milk before feeding will soften your breasts, making  it easier for your baby to latch on. Wear a well fitting nursing bra and air dry your nipples for 10 to 15 minutes after each feeding.   Only use cotton bra pads.   Only use pure lanolin on your nipples after nursing. You do not need to wash it off before nursing.  Take care of yourself by:   Eating well-balanced meals and nutritious snacks.   Drinking milk, fruit juice, and water to satisfy your thirst (about 8 glasses a day).   Getting plenty of rest.   Increasing calcium in   your diet (1200 mg a day).   Avoiding foods that you notice affect the baby in a bad way.  SEEK MEDICAL CARE IF:   You have any questions or difficulty with breastfeeding.   You need help.   You have a hard, red, sore area on your breast, accompanied by a fever of 100.5 F (38.1 C) or more.   Your baby is too sleepy to eat well or is having trouble sleeping.   Your baby is wetting less than 6 diapers per day, by 49 days of age.   Your baby's skin or white part of his or her eyes is more yellow than it was in the hospital.   You feel depressed.  Document Released: 11/10/2005 Document Revised: 10/30/2011 Document Reviewed: 06/25/2009 [facility] Patient Information 2012 [place], [place].Vaginal Delivery Care After  Change your pad on each trip to the bathroom.   Wipe gently with toilet paper during your hospital stay. Always wipe from front to back. A spray bottle with warm tap water could also be used or a towelette if available.   Place your soiled pad and toilet paper in a bathroom wastebasket with a plastic bag liner.   During your hospital stay, save any clots. If you pass a clot while on the toilet, do not flush it. Also, if your vaginal flow seems excessive to you, notify nursing personnel.   The first time you get out of bed after delivery, wait for assistance from a nurse. Do not get up alone at any time if you feel weak or dizzy.   Bend and extend your ankles forcefully so that you feel the  calves of your legs get hard. Do this 6 times every hour when you are in bed and awake.   Do not sit with one foot under you, dangle your legs over the edge of the bed, or maintain a position that hinders the circulation in your legs.   Many women experience after pains for 2 to 3 days after delivery. These after pains are mild uterine contractions. Ask the nurse for a pain medication if you need something for this. Sometimes breastfeeding stimulates after pains; if you find this to be true, ask for the medication  -  hour before the next feeding.   For you and your infant's protection, do not go beyond the door(s) of the obstetric unit. Do not carry your baby in your arms in the hallway. When taking your baby to and from your room, put your baby in the bassinet and push the bassinet.   Mothers may have their babies in their room as much as they desire.  Document Released: 11/07/2000 Document Revised: 10/30/2011 Document Reviewed: 10/08/2007 [facility] Patient Information 2012 [place], [place]. Iron-Rich Diet An iron-rich diet contains foods that are good sources of iron. Iron is an important mineral that helps your body produce hemoglobin. Hemoglobin is a protein in red blood cells that carries oxygen to the body's tissues. Sometimes, the iron level in your blood can be low. This may be caused by:  A lack of iron in your diet.   Blood loss.   Times of growth, such as during pregnancy or during a child's growth and development.  Low levels of iron can cause a decrease in the number of red blood cells. This can result in iron deficiency anemia. Iron deficiency anemia symptoms include:  Tiredness.   Weakness.   Irritability.   Increased chance of infection.  Here are some recommendations for daily iron  intake:  Males older than 36 years of age need 8 mg of iron per day.   Women ages 60 to 47 need 18 mg of iron per day.   Pregnant women need 27 mg of iron per day, and women who are over  60 years of age and breastfeeding need 9 mg of iron per day.   Women over the age of 98 need 8 mg of iron per day.  SOURCES OF IRON There are 2 types of iron that are found in food: heme iron and nonheme iron. Heme iron is absorbed by the body better than nonheme iron. Heme iron is found in meat, poultry, and fish. Nonheme iron is found in grains, beans, and vegetables. Heme Iron Sources Food / Iron (mg)  Chicken liver, 3 oz (85 g)/ 10 mg   Beef liver, 3 oz (85 g)/ 5.5 mg   Oysters, 3 oz (85 g)/ 8 mg   Beef, 3 oz (85 g)/ 2 to 3 mg   Shrimp, 3 oz (85 g)/ 2.8 mg   [place], 3 oz (85 g)/ 2 mg   Chicken, 3 oz (85 g) / 1 mg   Fish (tuna, halibut), 3 oz (85 g)/ 1 mg   Pork, 3 oz (85 g)/ 0.9 mg  Nonheme Iron Sources Food / Iron (mg)  Ready-to-eat breakfast cereal, iron-fortified / 3.9 to 7 mg   Tofu,  cup / 3.4 mg   Kidney beans,  cup / 2.6 mg   Baked potato with skin / 2.7 mg   Asparagus,  cup / 2.2 mg   Avocado / 2 mg   Dried peaches,  cup / 1.6 mg   Raisins,  cup / 1.5 mg   Soy milk, 1 cup / 1.5 mg   Whole-wheat bread, 1 slice / 1.2 mg   Spinach, 1 cup / 0.8 mg   Broccoli,  cup / 0.6 mg  IRON ABSORPTION Certain foods can decrease the body's absorption of iron. Try to avoid these foods and beverages while eating meals with iron-containing foods:  Coffee.   Tea.   Fiber.   Soy.  Foods containing vitamin C can help increase the amount of iron your body absorbs from iron sources, especially from nonheme sources. Eat foods with vitamin C along with iron-containing foods to increase your iron absorption. Foods that are high in vitamin C include many fruits and vegetables. Some good sources are:  Fresh orange juice.   Oranges.   Strawberries.   Mangoes.   Grapefruit.   Red bell peppers.   Green bell peppers.   Broccoli.   Potatoes with skin.   Tomato juice.  Document Released: 06/24/2005 Document Revised: 10/30/2011 Document Reviewed:  05/01/2011 [facility] Patient Information 2012 [place], [place].

## 2012-02-07 NOTE — Progress Notes (Addendum)
Patient ID: Gina Schneider, female   DOB: 19-Apr-1976, 36 y.o.   MRN: [id number] Post Partum Day 2 Subjective: no complaints, up ad lib without syncope, voiding, tolerating PO, + flatus  Pain well controlled with po meds BF well Mood stable, bonding well Baby not discharged yet - bilirubin increased, may stay overnight    Objective: Blood pressure 130/72, pulse 102, temperature 97.8 F (36.6 C), temperature source Oral, resp. rate 20, height 5\' 7"  (1.702 m), weight 129.275 kg (285 lb), SpO2 97.00%, unknown if currently breastfeeding.  Physical Exam:  General: alert and no distress Lungs: CTAB Heart: RRR Breasts: soft, filling Lochia: appropriate Uterine Fundus: firm Perineum: WNL DVT Evaluation: No evidence of DVT seen on physical exam. Negative Homan's sign. Mild pedal edema   Basename 02/07/12 0802 02/05/12 1556  HGB 8.9* 10.9*  HCT 28.7* 35.1*    Assessment/Plan: Stable PP - anemia - will check orthostatics, begin FE supp Plans condoms/vasectomy for contraception Will hold mom's D/C until this evening - secondary to baby not dc'd yet Lactating       LOS: 2 days   Camilo Mander M 02/07/2012, 8:40 AM     Addendum:  Infant staying as inpatient for phototherapy Mom doing well Requested RX for zoloft, secondary to hx of anxiety Rx: zofoft 25mg  PO daily given  dc'd home stable condition  S.Maurianna Benard, CNM

## 2012-02-07 NOTE — Progress Notes (Signed)
Pt discharge with prescriptions.  Baby made baby patient.

## 2012-02-08 NOTE — Discharge Summary (Signed)
Obstetric Discharge Summary Reason for Admission: onset of labor and rupture of membranes, TOLAC Prenatal Procedures: cerclage, NST, ultrasound and BMZ course Intrapartum Procedures: spontaneous vaginal delivery Postpartum Procedures: none Complications-Operative and Postpartum: 2nd degree perineal laceration    Hemoglobin  Date Value Range Status  02/07/2012 8.9* 12.0-15.0 (g/dL) Final     DELTA CHECK NOTED     REPEATED TO VERIFY     HCT  Date Value Range Status  02/07/2012 28.7* 36.0-46.0 (%) Final    Hospital Course:  Hospital Course: Admitted in labor with SROM at 36wks. pos GBS. Progressed to fully dilated, without complications. Delivery was performed by V.Emilee Hero, CNM without difficulty. Patient and baby tolerated the procedure without difficulty, with a 2nd degree laceration noted. Infant to FTN. Mother and infant then had an uncomplicated postpartum course, with breast feeding going well. Baby did stay an extra night with mom rooming in to receive phototherapy. Mom's physical exam was WNL, and she was discharged home in stable condition. Contraception plan was condoms.  She received adequate benefit from po pain medications. She c/o mild anxiety and desired RX for zoloft  Discharge Diagnoses: successful VBAC, preterm 36wks  Discharge Information: Date: 02/08/2012 Activity: pelvic rest Diet: routine Medications:  Medication List  As of 02/08/2012 10:52 PM   START taking these medications         ibuprofen 600 MG tablet   Commonly known as: ADVIL,MOTRIN   Take 1 tablet (600 mg total) by mouth every 6 (six) hours.      iron polysaccharides 150 MG capsule   Commonly known as: NIFEREX   Take 1 capsule (150 mg total) by mouth daily.      sertraline 25 MG tablet   Commonly known as: ZOLOFT   Take 1 tablet (25 mg total) by mouth daily.         CONTINUE taking these medications         acetaminophen 500 MG tablet   Commonly known as: TYLENOL      budesonide 180  MCG/ACT inhaler   Commonly known as: PULMICORT      cetirizine 10 MG tablet   Commonly known as: ZYRTEC      chlorpheniramine-HYDROcodone 10-8 MG/5ML Lqcr   Commonly known as: TUSSIONEX      guaiFENesin 600 MG 12 hr tablet   Commonly known as: MUCINEX      levalbuterol 1.25 MG/3ML nebulizer solution   Commonly known as: XOPENEX      levalbuterol 45 MCG/ACT inhaler   Commonly known as: XOPENEX HFA      OVER THE COUNTER MEDICATION      pantoprazole 40 MG tablet   Commonly known as: PROTONIX      RHINOCORT AQUA 32 MCG/ACT nasal spray   Generic drug: budesonide         STOP taking these medications         nitrofurantoin (macrocrystal-monohydrate) 100 MG capsule      PREDNISONE PO          Where to get your medications    These are the prescriptions that you need to pick up.   You may get these medications from any pharmacy.         ibuprofen 600 MG tablet   iron polysaccharides 150 MG capsule   sertraline 25 MG tablet           Condition: stable Instructions: refer to practice specific booklet Discharge to: home Follow-up Information    Follow up with [facility]ob  in 6 weeks. (As needed if symptoms worsen)          Newborn Data: Live born  Information for the patient's newborn:  Oneal, Biglow [[id number]]  female ; APGAR ,9, 9  ; weight ; 7#8oz Home with mother.  Azelie Noguera M 02/08/2012, 10:52 PM

## 2012-03-09 ENCOUNTER — Other Ambulatory Visit: Payer: Self-pay | Admitting: Obstetrics and Gynecology

## 2012-03-09 ENCOUNTER — Encounter (HOSPITAL_COMMUNITY)
Admission: RE | Admit: 2012-03-09 | Discharge: 2012-03-09 | Disposition: A | Discharge status: Home / Self Care | Payer: Managed Care, Other (non HMO) | Source: Ambulatory Visit | Attending: Obstetrics and Gynecology | Admitting: Obstetrics and Gynecology

## 2012-03-09 DIAGNOSIS — O923 Agalactia: Secondary | ICD-10-CM | POA: Insufficient documentation

## 2012-03-10 NOTE — Telephone Encounter (Signed)
yes

## 2012-03-30 ENCOUNTER — Other Ambulatory Visit: Payer: Self-pay | Admitting: Obstetrics and Gynecology

## 2012-03-31 NOTE — Telephone Encounter (Signed)
Can pt have refill on rx 

## 2012-04-09 ENCOUNTER — Encounter (HOSPITAL_COMMUNITY)
Admission: RE | Admit: 2012-04-09 | Discharge: 2012-04-09 | Disposition: A | Discharge status: Home / Self Care | Payer: Managed Care, Other (non HMO) | Source: Ambulatory Visit | Attending: Obstetrics and Gynecology | Admitting: Obstetrics and Gynecology

## 2012-04-09 DIAGNOSIS — O923 Agalactia: Secondary | ICD-10-CM | POA: Insufficient documentation

## 2012-04-12 ENCOUNTER — Encounter: Payer: Self-pay | Admitting: Obstetrics and Gynecology

## 2012-04-13 ENCOUNTER — Ambulatory Visit (INDEPENDENT_AMBULATORY_CARE_PROVIDER_SITE_OTHER): Payer: Managed Care, Other (non HMO) | Admitting: Obstetrics and Gynecology

## 2012-04-13 ENCOUNTER — Encounter: Payer: Self-pay | Admitting: Obstetrics and Gynecology

## 2012-04-13 DIAGNOSIS — O34219 Maternal care for unspecified type scar from previous cesarean delivery: Secondary | ICD-10-CM

## 2012-04-13 MED ORDER — NORETHINDRONE 0.35 MG PO TABS: 1.0000 | ORAL_TABLET | Freq: Every day | ORAL | Status: DC

## 2012-04-13 NOTE — Progress Notes (Deleted)
Vaginal Discharge/Discomfort/Itching  Subjective:   DIERA WIRKKALA is an 36 y.o. woman who presents c/o ***  Objective: {Exam; pelvic:16939} Vaginal discharge: {VaginalDisch:12179} Vaginal lesions:  {DIAGNOSES; VAGINAL LESIONS:12191}  Wet prep: {wet mount:5123} OSOM BV: {pos/neg/not done:321853} OSOM Trichomonas:  {pos/neg/not done:321853}  Assessment: ***  Plan: Additional Tests:{Findings; lab culture vaginal:740}  Medications: {Meds; vaginal:60347} Counseling:  {counseling:60259}   Follow-up: {follow up:15908}  Nigel Bridgeman MD 04/13/2012 11:55 AM

## 2012-04-13 NOTE — Progress Notes (Signed)
Subjective:     Gina Schneider is a 36 y.o. female who presents for a postpartum visit.  I have fully reviewed the prenatal and intrapartum course.  Had hx PT delivery with 1st baby, emergency C/S with 2nd baby, VBAC with this child after cerclage (had also miscarried twins last year). Denies pp depression--PPDS = 2.  Patient is not sexually active at present.  Husband had vasectomy--1st appointment for checking by urologist in July.  The following portions of the patient's history were reviewed and updated as appropriate: allergies, current medications, past family history, past medical history, past social history, past surgical history and problem list.  Review of Systems Pertinent items are noted in HPI.   Objective:    BP 108/70  Temp 98.3 F (36.8 C)  Resp 16  Ht 5\' 8"  (1.727 m)  Wt 260 lb (117.935 kg)  BMI 39.53 kg/m2  Breastfeeding? No  General:  alert, cooperative and no distress     Lungs: clear to auscultation bilaterally  Heart:  regular rate and rhythm, S1, S2 normal, no murmur  Abdomen: soft, non-tender; bowel sounds normal; no masses,  no organomegaly   Vulva:  normal  Vagina: normal vagina  Cervix:  normal  Corpus: normal size, contour, position, consistency, mobility, non-tender  Adnexa:  normal adnexa             Assessment:     Normal postpartum exam.  Pap smear not done at today's visit.   Plan:     1. Contraception: oral progesterone-only contraceptive--needs Rx until vasectomy cleared. 2. Follow up within: 3 months for pap or prn 3.  Rx Micronor for 1 year.    Nigel Bridgeman, CNM 04/13/2012 4:20 PM

## 2012-04-13 NOTE — Progress Notes (Signed)
Date of delivery: 02/05/2012 Female Name: Gina Schneider Vaginal delivery:yes Cesarean section:no Tubal ligation:no GDM:no Breast Feeding:no Bottle Feeding:yes Post-Partum Blues:no Abnormal pap:yes Normal GU function: yes Normal GI function:yes Returning to work:yes; has already returned back to work  EPDS- 6  Husband has vasectomy

## 2012-04-16 ENCOUNTER — Telehealth: Payer: Self-pay | Admitting: Obstetrics and Gynecology

## 2012-04-16 NOTE — Telephone Encounter (Signed)
Triage/epic 

## 2012-04-16 NOTE — Telephone Encounter (Signed)
Spoke with pt states she wants to go fourth with bladder surgery her and Dr.Dillard discussed advised pt wil inform ND and call her back pt voice understanding

## 2012-04-29 NOTE — Telephone Encounter (Signed)
Pt called NA LM

## 2012-05-06 ENCOUNTER — Telehealth: Payer: Self-pay | Admitting: Obstetrics and Gynecology

## 2012-05-10 ENCOUNTER — Encounter (HOSPITAL_COMMUNITY)
Admission: RE | Admit: 2012-05-10 | Discharge: 2012-05-10 | Disposition: A | Discharge status: Home / Self Care | Payer: Managed Care, Other (non HMO) | Source: Ambulatory Visit | Attending: Obstetrics and Gynecology | Admitting: Obstetrics and Gynecology

## 2012-05-10 DIAGNOSIS — O923 Agalactia: Secondary | ICD-10-CM | POA: Insufficient documentation

## 2012-05-13 ENCOUNTER — Ambulatory Visit (INDEPENDENT_AMBULATORY_CARE_PROVIDER_SITE_OTHER): Payer: Managed Care, Other (non HMO) | Admitting: Obstetrics and Gynecology

## 2012-05-13 DIAGNOSIS — Z331 Pregnant state, incidental: Secondary | ICD-10-CM

## 2012-05-13 DIAGNOSIS — N3946 Mixed incontinence: Secondary | ICD-10-CM

## 2012-06-05 ENCOUNTER — Ambulatory Visit (INDEPENDENT_AMBULATORY_CARE_PROVIDER_SITE_OTHER): Payer: Managed Care, Other (non HMO) | Admitting: Emergency Medicine

## 2012-06-05 VITALS — BP 110/72 | HR 64 | Temp 98.0°F | Resp 18 | Ht 67.0 in | Wt 252.4 lb

## 2012-06-05 DIAGNOSIS — M7710 Lateral epicondylitis, unspecified elbow: Secondary | ICD-10-CM

## 2012-06-05 MED ORDER — PANTOPRAZOLE SODIUM 40 MG PO TBEC: 40.0000 mg | DELAYED_RELEASE_TABLET | Freq: Every day | ORAL | Status: DC

## 2012-06-05 MED ORDER — CELECOXIB 200 MG PO CAPS: 200.0000 mg | ORAL_CAPSULE | Freq: Two times a day (BID) | ORAL | Status: AC

## 2012-06-05 NOTE — Progress Notes (Deleted)
  Subjective:    Patient ID: Gina Schneider, female    DOB: 12-02-1975, 36 y.o.   MRN: [id number]  HPI    Review of Systems     Objective:   Physical Exam        Assessment & Plan:
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
## 2012-06-10 ENCOUNTER — Encounter (HOSPITAL_COMMUNITY)
Admission: RE | Admit: 2012-06-10 | Discharge: 2012-06-10 | Disposition: A | Discharge status: Home / Self Care | Payer: Managed Care, Other (non HMO) | Source: Ambulatory Visit | Attending: Obstetrics and Gynecology | Admitting: Obstetrics and Gynecology

## 2012-06-10 DIAGNOSIS — O923 Agalactia: Secondary | ICD-10-CM | POA: Insufficient documentation

## 2012-06-11 ENCOUNTER — Encounter: Payer: Managed Care, Other (non HMO) | Admitting: Obstetrics and Gynecology

## 2012-07-11 ENCOUNTER — Encounter (HOSPITAL_COMMUNITY)
Admission: RE | Admit: 2012-07-11 | Discharge: 2012-07-11 | Disposition: A | Discharge status: Home / Self Care | Payer: Managed Care, Other (non HMO) | Source: Ambulatory Visit | Attending: Obstetrics and Gynecology | Admitting: Obstetrics and Gynecology

## 2012-07-11 DIAGNOSIS — O923 Agalactia: Secondary | ICD-10-CM | POA: Insufficient documentation

## 2012-08-11 ENCOUNTER — Encounter (HOSPITAL_COMMUNITY)
Admission: RE | Admit: 2012-08-11 | Discharge: 2012-08-11 | Disposition: A | Discharge status: Home / Self Care | Payer: Managed Care, Other (non HMO) | Source: Ambulatory Visit | Attending: Obstetrics and Gynecology | Admitting: Obstetrics and Gynecology

## 2012-08-11 DIAGNOSIS — O923 Agalactia: Secondary | ICD-10-CM | POA: Insufficient documentation

## 2012-09-11 ENCOUNTER — Encounter (HOSPITAL_COMMUNITY)
Admission: RE | Admit: 2012-09-11 | Discharge: 2012-09-11 | Disposition: A | Discharge status: Home / Self Care | Payer: Managed Care, Other (non HMO) | Source: Ambulatory Visit | Attending: Obstetrics and Gynecology | Admitting: Obstetrics and Gynecology

## 2012-09-11 DIAGNOSIS — O923 Agalactia: Secondary | ICD-10-CM | POA: Insufficient documentation

## 2012-09-22 ENCOUNTER — Ambulatory Visit: Payer: Managed Care, Other (non HMO)

## 2012-09-22 ENCOUNTER — Ambulatory Visit (INDEPENDENT_AMBULATORY_CARE_PROVIDER_SITE_OTHER): Payer: Managed Care, Other (non HMO) | Admitting: Family Medicine

## 2012-09-22 VITALS — BP 120/68 | HR 100 | Temp 98.8°F | Resp 17 | Ht 67.0 in | Wt 242.0 lb

## 2012-09-22 DIAGNOSIS — M79609 Pain in unspecified limb: Secondary | ICD-10-CM

## 2012-09-22 DIAGNOSIS — L02219 Cutaneous abscess of trunk, unspecified: Secondary | ICD-10-CM

## 2012-09-22 DIAGNOSIS — M79672 Pain in left foot: Secondary | ICD-10-CM

## 2012-09-22 DIAGNOSIS — L03311 Cellulitis of abdominal wall: Secondary | ICD-10-CM

## 2012-09-22 DIAGNOSIS — M7730 Calcaneal spur, unspecified foot: Secondary | ICD-10-CM

## 2012-09-22 DIAGNOSIS — M79629 Pain in unspecified upper arm: Secondary | ICD-10-CM

## 2012-09-22 LAB — POCT CBC
Granulocyte percent: 63.4 %G (ref 37–80)
MCH, POC: 25.1 pg — AB (ref 27–31.2)
MCV: 84.7 fL (ref 80–97)
MID (cbc): 0.5 (ref 0–0.9)
POC LYMPH PERCENT: 30.5 %L (ref 10–50)
Platelet Count, POC: 340 10*3/uL (ref 142–424)
RDW, POC: 15.9 %
WBC: 7.5 10*3/uL (ref 4.6–10.2)

## 2012-09-22 MED ORDER — SULFAMETHOXAZOLE-TRIMETHOPRIM 800-160 MG PO TABS: 1.0000 | ORAL_TABLET | Freq: Two times a day (BID) | ORAL | Status: DC

## 2012-09-22 MED ORDER — FLUCONAZOLE 150 MG PO TABS: 150.0000 mg | ORAL_TABLET | Freq: Once | ORAL | Status: DC

## 2012-09-22 MED ORDER — CEPHALEXIN 500 MG PO CAPS: 500.0000 mg | ORAL_CAPSULE | Freq: Four times a day (QID) | ORAL | Status: DC
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
## 2012-09-27 ENCOUNTER — Telehealth: Payer: Self-pay

## 2012-09-27 NOTE — Telephone Encounter (Signed)
Agree that pt needs recheck

## 2012-09-27 NOTE — Telephone Encounter (Signed)
Spoke w/Heather who stated that pt should RTC for recheck if Sxs are worsening and she should not swim until she is seen again for recheck. Called pt and advised her of this and pt agreed to RTC.  Dr Clelia Croft, please review labs and OV notes from 10/30 OV.

## 2012-09-27 NOTE — Telephone Encounter (Signed)
Pt called to ask about CBC lab results. I advised pt that they have not been reviewed by Dr Clelia Croft yet, but that she will be in the office this afternoon/evening and I will ask her to review them so we can call her back. Pt is concerned because the area of redness that was inside belly button has spread so that it is now out of the belly button on the area surrounding it and it is about 3 x the size as at OV. Advised pt that she should RTC for recheck. Pt stated that at OV Dr Clelia Croft stated that it might be yeast and that it looked a little like cellulitis. 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 Please advise.

## 2012-10-12 ENCOUNTER — Encounter (HOSPITAL_COMMUNITY)
Admission: RE | Admit: 2012-10-12 | Discharge: 2012-10-12 | Disposition: A | Discharge status: Home / Self Care | Payer: Managed Care, Other (non HMO) | Source: Ambulatory Visit | Attending: Obstetrics and Gynecology | Admitting: Obstetrics and Gynecology

## 2012-10-12 DIAGNOSIS — O923 Agalactia: Secondary | ICD-10-CM | POA: Insufficient documentation

## 2012-10-15 ENCOUNTER — Other Ambulatory Visit: Payer: Self-pay | Admitting: Obstetrics and Gynecology

## 2012-10-16 ENCOUNTER — Telehealth: Payer: Self-pay

## 2012-10-16 NOTE — Telephone Encounter (Signed)
callling to get lab results. Also she was here for a rash in her belly button area. After several diff antibotics (she also went to see another doc), the rash went away, but now she has hives and bruising.

## 2012-10-17 NOTE — Telephone Encounter (Signed)
It appears that at her visit a month ago, all we did was a cbc which we discussed w/ her at her OV as it was normal.  Since her visit was about 3-4 wks ago and she has been on other meds (or had other w/u done since), she definitely needs to be evaluated in a clinic visit for this - she could have had an allergic reaction to something.  Take benadryl 50mg  qhs and cetirizine 10mg  qam for hives and come into clinic for eval

## 2012-10-18 NOTE — Telephone Encounter (Signed)
I have spoken to patient she is advised, she states she will go somewhere else, she does not want to come back here because she had to wait last time.

## 2012-10-29 ENCOUNTER — Other Ambulatory Visit: Payer: Self-pay | Admitting: Family Medicine

## 2012-11-02 ENCOUNTER — Telehealth: Payer: Self-pay | Admitting: Obstetrics and Gynecology

## 2012-11-02 NOTE — Telephone Encounter (Signed)
TC from pt.  Agrees to see DR AR 11/03/12.

## 2012-11-02 NOTE — Telephone Encounter (Signed)
TC to pt.  States is having increased urinary incontinence, not only with coughing or sneezing. Is sometimes soaking thick pads. Will consult with DR ND.

## 2012-11-02 NOTE — Telephone Encounter (Signed)
Per consult with DR ND, OK for pt to see DR AR 11/03/12.  TC to pt.  LM to return call.

## 2012-11-03 ENCOUNTER — Encounter: Payer: Self-pay | Admitting: Obstetrics and Gynecology

## 2012-11-03 ENCOUNTER — Ambulatory Visit (INDEPENDENT_AMBULATORY_CARE_PROVIDER_SITE_OTHER): Payer: Managed Care, Other (non HMO) | Admitting: Obstetrics and Gynecology

## 2012-11-03 VITALS — BP 90/70 | Temp 100.2°F | Ht 67.0 in | Wt 240.0 lb

## 2012-11-03 DIAGNOSIS — R21 Rash and other nonspecific skin eruption: Secondary | ICD-10-CM

## 2012-11-03 DIAGNOSIS — R6889 Other general symptoms and signs: Secondary | ICD-10-CM

## 2012-11-03 DIAGNOSIS — J111 Influenza due to unidentified influenza virus with other respiratory manifestations: Secondary | ICD-10-CM

## 2012-11-03 DIAGNOSIS — R350 Frequency of micturition: Secondary | ICD-10-CM

## 2012-11-03 LAB — INFLUENZA A AND B: Influenza B Ag: NEGATIVE

## 2012-11-03 MED ORDER — TOLTERODINE TARTRATE ER 4 MG PO CP24: 4.0000 mg | ORAL_CAPSULE | Freq: Every day | ORAL | Status: DC

## 2012-11-03 MED ORDER — OSELTAMIVIR PHOSPHATE 75 MG PO CAPS: 75.0000 mg | ORAL_CAPSULE | Freq: Two times a day (BID) | ORAL | Status: DC

## 2012-11-03 NOTE — Addendum Note (Signed)
Addended by: Stephens Shire on: 11/03/2012 12:06 PM   Modules accepted: Orders

## 2012-11-03 NOTE — Addendum Note (Signed)
Addended by: Stephens Shire on: 11/03/2012 02:38 PM   Modules accepted: Orders

## 2012-11-03 NOTE — Progress Notes (Addendum)
Here to f/u lumax.  C/o ? Flu but no fever request flu testing.  C/o abdominal rash on and off x .  Filed Vitals:   11/03/12 1040  BP: 90/70  Temp: 100.2 F (37.9 C)   No overt leakage on testing with nl PVR Papular rash noted  A/P Clinical mixed incontinence Rec trial of detrol la with bladder training rto 6-8wks If still with SUI, discussed TVT/cystoscopy (husband had vasectomy) Pelvic exam at [facility] Flu testing today Refer to derm for rash s/p course of benadryl

## 2012-11-03 NOTE — Addendum Note (Signed)
Addended by: Stephens Shire on: 11/03/2012 12:11 PM   Modules accepted: Orders

## 2012-11-08 ENCOUNTER — Telehealth: Payer: Self-pay

## 2012-11-08 NOTE — Telephone Encounter (Signed)
Gina Schneider notified this pt of her  Positive influenza  And called in Tamaflu. Last week. Gina Schneider

## 2012-11-09 ENCOUNTER — Ambulatory Visit: Payer: Managed Care, Other (non HMO) | Admitting: Family Medicine

## 2012-11-11 ENCOUNTER — Encounter (HOSPITAL_COMMUNITY)
Admission: RE | Admit: 2012-11-11 | Discharge: 2012-11-11 | Disposition: A | Discharge status: Home / Self Care | Payer: Managed Care, Other (non HMO) | Source: Ambulatory Visit | Attending: Obstetrics and Gynecology | Admitting: Obstetrics and Gynecology

## 2012-11-11 ENCOUNTER — Ambulatory Visit (INDEPENDENT_AMBULATORY_CARE_PROVIDER_SITE_OTHER): Payer: Managed Care, Other (non HMO) | Admitting: Family Medicine

## 2012-11-11 ENCOUNTER — Encounter: Payer: Self-pay | Admitting: Family Medicine

## 2012-11-11 VITALS — BP 124/84 | HR 84 | Temp 98.0°F | Ht 68.0 in | Wt 238.0 lb

## 2012-11-11 DIAGNOSIS — Z7189 Other specified counseling: Secondary | ICD-10-CM

## 2012-11-11 DIAGNOSIS — F419 Anxiety disorder, unspecified: Secondary | ICD-10-CM

## 2012-11-11 DIAGNOSIS — O923 Agalactia: Secondary | ICD-10-CM | POA: Insufficient documentation

## 2012-11-11 DIAGNOSIS — F411 Generalized anxiety disorder: Secondary | ICD-10-CM

## 2012-11-11 DIAGNOSIS — Z7689 Persons encountering health services in other specified circumstances: Secondary | ICD-10-CM

## 2012-11-11 DIAGNOSIS — K219 Gastro-esophageal reflux disease without esophagitis: Secondary | ICD-10-CM

## 2012-11-11 MED ORDER — PANTOPRAZOLE SODIUM 40 MG PO TBEC: 40.0000 mg | DELAYED_RELEASE_TABLET | Freq: Every day | ORAL | Status: DC

## 2012-11-11 NOTE — Patient Instructions (Addendum)
-  PLEASE SIGN UP FOR MYCHART TODAY   Please call to set up appointment for testing for ADD. If positive we will get you in to see psych for discussion of medication options.  We wil plan to do your labs and vaccines at your physical per your request.  We recommend the following healthy lifestyle measures: - eat a healthy diet consisting of lots of vegetables, fruits, beans, nuts, seeds, healthy meats such as white chicken and fish and whole grains.  - avoid fried foods, fast food, processed foods, sodas, red meet and other fattening foods.  - get a least 150 minutes of aerobic exercise per week.   Follow up in: as scheduled
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
## 2012-11-18 ENCOUNTER — Encounter: Payer: Managed Care, Other (non HMO) | Admitting: Family Medicine

## 2012-11-18 ENCOUNTER — Ambulatory Visit (INDEPENDENT_AMBULATORY_CARE_PROVIDER_SITE_OTHER): Payer: Managed Care, Other (non HMO) | Admitting: *Deleted

## 2012-11-18 ENCOUNTER — Telehealth: Payer: Self-pay | Admitting: Family Medicine

## 2012-11-18 ENCOUNTER — Other Ambulatory Visit (INDEPENDENT_AMBULATORY_CARE_PROVIDER_SITE_OTHER): Payer: Managed Care, Other (non HMO) | Admitting: Family Medicine

## 2012-11-18 DIAGNOSIS — Z23 Encounter for immunization: Secondary | ICD-10-CM

## 2012-11-18 DIAGNOSIS — Z0000 Encounter for general adult medical examination without abnormal findings: Secondary | ICD-10-CM

## 2012-11-18 DIAGNOSIS — E669 Obesity, unspecified: Secondary | ICD-10-CM

## 2012-11-18 LAB — LIPID PANEL
HDL: 42.9 mg/dL (ref >39.00)
Triglycerides: 115 mg/dL (ref 0.0–149.0)

## 2012-11-18 NOTE — Telephone Encounter (Signed)
Please let patient know, since not yet signed up for mychart, wanted to contact regarding lab results. Recommend signing up for mychart to see these results in about 10 days.  -cholesterol is good -diabetes screening lab is a little high (>5.6) indicating a risk for developing diabetes  The best treatment to hopefully reverse these findings and prevent adverse health outcomes is a healthy diet and regular exercise.  Schedule follow up in about 3-4 months if not already scheduled to do so.

## 2012-11-18 NOTE — Progress Notes (Signed)
Pt cancelled appt as had CPE with gyn and just wanted labs and vaccines today. Nurse visit for vaccines and screening labs done.  This encounter was created in error - please disregard.

## 2012-11-18 NOTE — Telephone Encounter (Signed)
Patient is aware of lab results and was reminded of mychart.

## 2012-11-18 NOTE — Telephone Encounter (Signed)
Please let patient know, since not yet signed up for mychart, wanted to contact regarding lab results. Recommend signing up for mychart to see these results in about 10 days.  -cholesterol is good -diabetes screening lab is a little high (>5.6) indicating a risk for developing diabetes  The best treatment to hopefully reverse these findings and prevent adverse health outcomes is a healthy diet and regular exercise.  Schedule follow up in about 3-4 months if not already scheduled to do so.  

## 2012-12-12 ENCOUNTER — Encounter (HOSPITAL_COMMUNITY)
Admission: RE | Admit: 2012-12-12 | Discharge: 2012-12-12 | Disposition: A | Discharge status: Home / Self Care | Payer: Managed Care, Other (non HMO) | Source: Ambulatory Visit | Attending: Obstetrics and Gynecology | Admitting: Obstetrics and Gynecology

## 2012-12-12 DIAGNOSIS — O923 Agalactia: Secondary | ICD-10-CM | POA: Insufficient documentation

## 2013-01-12 ENCOUNTER — Encounter (HOSPITAL_COMMUNITY)
Admission: RE | Admit: 2013-01-12 | Discharge: 2013-01-12 | Disposition: A | Discharge status: Home / Self Care | Payer: Managed Care, Other (non HMO) | Source: Ambulatory Visit | Attending: Obstetrics and Gynecology | Admitting: Obstetrics and Gynecology

## 2013-01-12 DIAGNOSIS — O923 Agalactia: Secondary | ICD-10-CM | POA: Insufficient documentation

## 2013-02-10 ENCOUNTER — Encounter (HOSPITAL_COMMUNITY)
Admission: RE | Admit: 2013-02-10 | Discharge: 2013-02-10 | Disposition: A | Discharge status: Home / Self Care | Payer: Managed Care, Other (non HMO) | Source: Ambulatory Visit | Attending: Obstetrics and Gynecology | Admitting: Obstetrics and Gynecology

## 2013-02-10 DIAGNOSIS — O923 Agalactia: Secondary | ICD-10-CM | POA: Insufficient documentation

## 2013-03-13 ENCOUNTER — Encounter (HOSPITAL_COMMUNITY)
Admission: RE | Admit: 2013-03-13 | Discharge: 2013-03-13 | Disposition: A | Discharge status: Home / Self Care | Payer: Managed Care, Other (non HMO) | Source: Ambulatory Visit | Attending: Obstetrics and Gynecology | Admitting: Obstetrics and Gynecology

## 2013-03-13 DIAGNOSIS — O923 Agalactia: Secondary | ICD-10-CM | POA: Insufficient documentation

## 2013-04-03 NOTE — Progress Notes (Signed)
erroneus encounter

## 2013-04-13 ENCOUNTER — Encounter (HOSPITAL_COMMUNITY)
Admission: RE | Admit: 2013-04-13 | Discharge: 2013-04-13 | Disposition: A | Discharge status: Home / Self Care | Payer: Managed Care, Other (non HMO) | Source: Ambulatory Visit | Attending: Obstetrics and Gynecology | Admitting: Obstetrics and Gynecology

## 2013-04-13 DIAGNOSIS — O923 Agalactia: Secondary | ICD-10-CM | POA: Insufficient documentation

## 2013-05-14 ENCOUNTER — Encounter (HOSPITAL_COMMUNITY)
Admission: RE | Admit: 2013-05-14 | Discharge: 2013-05-14 | Disposition: A | Discharge status: Home / Self Care | Payer: Managed Care, Other (non HMO) | Source: Ambulatory Visit | Attending: Obstetrics and Gynecology | Admitting: Obstetrics and Gynecology

## 2013-05-14 DIAGNOSIS — O923 Agalactia: Secondary | ICD-10-CM | POA: Insufficient documentation

## 2013-06-13 ENCOUNTER — Encounter (HOSPITAL_COMMUNITY)
Admission: RE | Admit: 2013-06-13 | Discharge: 2013-06-13 | Disposition: A | Discharge status: Home / Self Care | Payer: BC Managed Care – PPO | Source: Ambulatory Visit | Attending: Obstetrics and Gynecology | Admitting: Obstetrics and Gynecology

## 2013-06-13 DIAGNOSIS — O923 Agalactia: Secondary | ICD-10-CM | POA: Insufficient documentation

## 2013-07-14 ENCOUNTER — Encounter (HOSPITAL_COMMUNITY)
Admission: RE | Admit: 2013-07-14 | Discharge: 2013-07-14 | Disposition: A | Discharge status: Home / Self Care | Payer: BC Managed Care – PPO | Source: Ambulatory Visit | Attending: Obstetrics and Gynecology | Admitting: Obstetrics and Gynecology

## 2013-07-14 DIAGNOSIS — O923 Agalactia: Secondary | ICD-10-CM | POA: Insufficient documentation

## 2013-08-14 ENCOUNTER — Encounter (HOSPITAL_COMMUNITY)
Admission: RE | Admit: 2013-08-14 | Discharge: 2013-08-14 | Disposition: A | Discharge status: Home / Self Care | Payer: BC Managed Care – PPO | Source: Ambulatory Visit | Attending: Obstetrics and Gynecology | Admitting: Obstetrics and Gynecology

## 2013-08-14 DIAGNOSIS — O923 Agalactia: Secondary | ICD-10-CM | POA: Insufficient documentation

## 2013-09-05 ENCOUNTER — Other Ambulatory Visit: Payer: Self-pay | Admitting: Gastroenterology

## 2013-09-05 ENCOUNTER — Telehealth: Payer: Self-pay | Admitting: Gastroenterology

## 2013-09-05 DIAGNOSIS — R1013 Epigastric pain: Secondary | ICD-10-CM

## 2013-09-05 NOTE — Telephone Encounter (Signed)
Left message on machine to call back  

## 2013-09-07 NOTE — Telephone Encounter (Signed)
Left message on machine to call back I will await further communication from the pt 

## 2013-09-12 ENCOUNTER — Other Ambulatory Visit: Payer: Managed Care, Other (non HMO)

## 2013-09-14 ENCOUNTER — Encounter (HOSPITAL_COMMUNITY)
Admission: RE | Admit: 2013-09-14 | Discharge: 2013-09-14 | Disposition: A | Discharge status: Home / Self Care | Payer: Managed Care, Other (non HMO) | Source: Ambulatory Visit | Attending: Obstetrics and Gynecology | Admitting: Obstetrics and Gynecology

## 2013-09-14 DIAGNOSIS — O923 Agalactia: Secondary | ICD-10-CM | POA: Insufficient documentation

## 2013-10-15 ENCOUNTER — Encounter (HOSPITAL_COMMUNITY)
Admission: RE | Admit: 2013-10-15 | Discharge: 2013-10-15 | Disposition: A | Discharge status: Home / Self Care | Payer: BC Managed Care – PPO | Source: Ambulatory Visit | Attending: Obstetrics and Gynecology | Admitting: Obstetrics and Gynecology

## 2013-10-15 DIAGNOSIS — O923 Agalactia: Secondary | ICD-10-CM | POA: Insufficient documentation

## 2013-11-15 ENCOUNTER — Encounter (HOSPITAL_COMMUNITY)
Admission: RE | Admit: 2013-11-15 | Discharge: 2013-11-15 | Disposition: A | Discharge status: Home / Self Care | Payer: BC Managed Care – PPO | Source: Ambulatory Visit | Attending: Obstetrics and Gynecology | Admitting: Obstetrics and Gynecology

## 2013-11-15 DIAGNOSIS — O923 Agalactia: Secondary | ICD-10-CM | POA: Insufficient documentation

## 2013-12-16 ENCOUNTER — Encounter (HOSPITAL_COMMUNITY)
Admission: RE | Admit: 2013-12-16 | Discharge: 2013-12-16 | Disposition: A | Discharge status: Home / Self Care | Payer: Managed Care, Other (non HMO) | Source: Ambulatory Visit | Attending: Obstetrics and Gynecology | Admitting: Obstetrics and Gynecology

## 2013-12-16 DIAGNOSIS — O923 Agalactia: Secondary | ICD-10-CM | POA: Insufficient documentation

## 2014-01-30 ENCOUNTER — Encounter: Payer: Self-pay | Admitting: Emergency Medicine

## 2014-01-30 ENCOUNTER — Emergency Department
Admission: EM | Admit: 2014-01-30 | Discharge: 2014-01-30 | Disposition: A | Discharge status: Home / Self Care | Payer: BC Managed Care – PPO | Source: Home / Self Care | Attending: Family Medicine | Admitting: Family Medicine

## 2014-01-30 DIAGNOSIS — J45909 Unspecified asthma, uncomplicated: Secondary | ICD-10-CM

## 2014-01-30 MED ORDER — AZITHROMYCIN 250 MG PO TABS: ORAL_TABLET | ORAL | Status: DC

## 2014-01-30 MED ORDER — PREDNISONE 20 MG PO TABS: 20.0000 mg | ORAL_TABLET | Freq: Two times a day (BID) | ORAL | Status: DC

## 2014-01-30 MED ORDER — METHYLPREDNISOLONE SODIUM SUCC 40 MG IJ SOLR
80.0000 mg | Freq: Once | INTRAMUSCULAR | Status: AC
  Administered 2014-01-30: 80 mg via INTRAMUSCULAR

## 2014-01-30 MED ORDER — BENZONATATE 200 MG PO CAPS: 200.0000 mg | ORAL_CAPSULE | Freq: Every day | ORAL | Status: DC

## 2014-01-30 NOTE — ED Notes (Signed)
Dry cough, congestion x 2&1/2 weeks
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
## 2014-01-30 NOTE — Discharge Instructions (Signed)
Take plain Mucinex (1200 mg guaifenesin) twice daily for cough and congestion.  May add Sudafed for sinus congestion.   Increase fluid intake, rest. May use Afrin nasal spray (or generic oxymetazoline) twice daily for about 5 days.  Also recommend using saline nasal spray several times daily and saline nasal irrigation (AYR is a common brand)  Stop all antihistamines for now, and other non-prescription cough/cold preparations. Continue albuterol inhaler as needed Follow-up with family doctor if not improving 7 to 10 days.

## 2014-02-02 ENCOUNTER — Telehealth: Payer: Self-pay | Admitting: Emergency Medicine

## 2014-02-14 ENCOUNTER — Emergency Department (HOSPITAL_COMMUNITY)
Admission: EM | Admit: 2014-02-14 | Discharge: 2014-02-14 | Disposition: A | Discharge status: Home / Self Care | Payer: BC Managed Care – PPO | Attending: Emergency Medicine | Admitting: Emergency Medicine

## 2014-02-14 ENCOUNTER — Encounter (HOSPITAL_COMMUNITY): Payer: Self-pay | Admitting: Emergency Medicine

## 2014-02-14 ENCOUNTER — Emergency Department (HOSPITAL_COMMUNITY): Payer: BC Managed Care – PPO

## 2014-02-14 DIAGNOSIS — Z87440 Personal history of urinary (tract) infections: Secondary | ICD-10-CM | POA: Insufficient documentation

## 2014-02-14 DIAGNOSIS — R42 Dizziness and giddiness: Secondary | ICD-10-CM | POA: Insufficient documentation

## 2014-02-14 DIAGNOSIS — Z87828 Personal history of other (healed) physical injury and trauma: Secondary | ICD-10-CM | POA: Insufficient documentation

## 2014-02-14 DIAGNOSIS — Z79899 Other long term (current) drug therapy: Secondary | ICD-10-CM | POA: Insufficient documentation

## 2014-02-14 DIAGNOSIS — S0990XA Unspecified injury of head, initial encounter: Secondary | ICD-10-CM | POA: Insufficient documentation

## 2014-02-14 DIAGNOSIS — Y9389 Activity, other specified: Secondary | ICD-10-CM | POA: Insufficient documentation

## 2014-02-14 DIAGNOSIS — J45909 Unspecified asthma, uncomplicated: Secondary | ICD-10-CM | POA: Insufficient documentation

## 2014-02-14 DIAGNOSIS — Z8659 Personal history of other mental and behavioral disorders: Secondary | ICD-10-CM | POA: Insufficient documentation

## 2014-02-14 DIAGNOSIS — Z87448 Personal history of other diseases of urinary system: Secondary | ICD-10-CM | POA: Insufficient documentation

## 2014-02-14 DIAGNOSIS — G43909 Migraine, unspecified, not intractable, without status migrainosus: Secondary | ICD-10-CM | POA: Insufficient documentation

## 2014-02-14 DIAGNOSIS — Y9289 Other specified places as the place of occurrence of the external cause: Secondary | ICD-10-CM | POA: Insufficient documentation

## 2014-02-14 DIAGNOSIS — IMO0002 Reserved for concepts with insufficient information to code with codable children: Secondary | ICD-10-CM | POA: Insufficient documentation

## 2014-02-14 DIAGNOSIS — Z91040 Latex allergy status: Secondary | ICD-10-CM | POA: Insufficient documentation

## 2014-02-14 DIAGNOSIS — E669 Obesity, unspecified: Secondary | ICD-10-CM | POA: Insufficient documentation

## 2014-02-14 DIAGNOSIS — Z880 Allergy status to penicillin: Secondary | ICD-10-CM | POA: Insufficient documentation

## 2014-02-14 DIAGNOSIS — Z8619 Personal history of other infectious and parasitic diseases: Secondary | ICD-10-CM | POA: Insufficient documentation

## 2014-02-14 DIAGNOSIS — F1010 Alcohol abuse, uncomplicated: Secondary | ICD-10-CM | POA: Insufficient documentation

## 2014-02-14 DIAGNOSIS — Z8742 Personal history of other diseases of the female genital tract: Secondary | ICD-10-CM | POA: Insufficient documentation

## 2014-02-14 DIAGNOSIS — K219 Gastro-esophageal reflux disease without esophagitis: Secondary | ICD-10-CM | POA: Insufficient documentation
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
## 2014-02-14 NOTE — Discharge Instructions (Signed)
Ibuprofen or tylenol for pain. Your CT is normal today. Please follow up with you doctor if symptoms not improving.    Concussion, Adult A concussion, or closed-head injury, is a brain injury caused by a direct blow to the head or by a quick and sudden movement (jolt) of the head or neck. Concussions are usually not life-threatening. Even so, the effects of a concussion can be serious. If you have had a concussion before, you are more likely to experience concussion-like symptoms after a direct blow to the head.  CAUSES   Direct blow to the head, such as from running into another player during a soccer game, being hit in a fight, or hitting your head on a hard surface.  A jolt of the head or neck that causes the brain to move back and forth inside the skull, such as in a car crash. SIGNS AND SYMPTOMS  The signs of a concussion can be hard to notice. Early on, they may be missed by you, family members, and health care providers. You may look fine but act or feel differently. Symptoms are usually temporary, but they may last for days, weeks, or even longer. Some symptoms may appear right away while others may not show up for hours or days. Every head injury is different. Symptoms include:   Mild to moderate headaches that will not go away.  A feeling of pressure inside your head.  Having more trouble than usual:   Learning or remembering things you have heard.  Answering questions.  Paying attention or concentrating.   Organizing daily tasks.   Making decisions and solving problems.   Slowness in thinking, acting or reacting, speaking, or reading.   Getting lost or being easily confused.   Feeling tired all the time or lacking energy (fatigued).   Feeling drowsy.   Sleep disturbances.   Sleeping more than usual.   Sleeping less than usual.   Trouble falling asleep.   Trouble sleeping (insomnia).   Loss of balance or feeling lightheaded or dizzy.    Nausea or vomiting.   Numbness or tingling.   Increased sensitivity to:   Sounds.   Lights.   Distractions.   Vision problems or eyes that tire easily.   Diminished sense of taste or smell.   Ringing in the ears.   Mood changes such as feeling sad or anxious.   Becoming easily irritated or angry for little or no reason.   Lack of motivation.  Seeing or hearing things other people do not see or hear (hallucinations). DIAGNOSIS  Your health care provider can usually diagnose a concussion based on a description of your injury and symptoms. He or she will ask whether you passed out (lost consciousness) and whether you are having trouble remembering events that happened right before and during your injury.  Your evaluation might include:   A brain scan to look for signs of injury to the brain. Even if the test shows no injury, you may still have a concussion.   Blood tests to be sure other problems are not present. TREATMENT   Concussions are usually treated in an emergency department, in urgent care, or at a clinic. You may need to stay in the hospital overnight for further treatment.   Tell your health care provider if you are taking any medicines, including prescription medicines, over-the-counter medicines, and natural remedies. Some medicines, such as blood thinners (anticoagulants) and aspirin, may increase the chance of complications. Also tell your health care provider  whether you have had alcohol or are taking illegal drugs. This information may affect treatment.  Your health care provider will send you home with important instructions to follow.  How fast you will recover from a concussion depends on many factors. These factors include how severe your concussion is, what part of your brain was injured, your age, and how healthy you were before the concussion.  Most people with mild injuries recover fully. Recovery can take time. In general, recovery is  slower in older persons. Also, persons who have had a concussion in the past or have other medical problems may find that it takes longer to recover from their current injury. HOME CARE INSTRUCTIONS  General Instructions  Carefully follow the directions your health care provider gave you.  Only take over-the-counter or prescription medicines for pain, discomfort, or fever as directed by your health care provider.  Take only those medicines that your health care provider has approved.  Do not drink alcohol until your health care provider says you are well enough to do so. Alcohol and certain other drugs may slow your recovery and can put you at risk of further injury.  If it is harder than usual to remember things, write them down.  If you are easily distracted, try to do one thing at a time. For example, do not try to watch TV while fixing dinner.  Talk with family members or close friends when making important decisions.  Keep all follow-up appointments. Repeated evaluation of your symptoms is recommended for your recovery.  Watch your symptoms and tell others to do the same. Complications sometimes occur after a concussion. Older adults with a brain injury may have a higher risk of serious complications such as of a blood clot on the brain.  Tell your teachers, school nurse, school counselor, coach, athletic trainer, or work Production designer, theatre/television/film about your injury, symptoms, and restrictions. Tell them about what you can or cannot do. They should watch for:   Increased problems with attention or concentration.   Increased difficulty remembering or learning new information.   Increased time needed to complete tasks or assignments.   Increased irritability or decreased ability to cope with stress.   Increased symptoms.   Rest. Rest helps the brain to heal. Make sure you:  Get plenty of sleep at night. Avoid staying up late at night.  Keep the same bedtime hours on weekends and  weekdays.  Rest during the day. Take daytime naps or rest breaks when you feel tired.  Limit activities that require a lot of thought or concentration. These includes   Doing homework or job-related work.   Watching TV.   Working on the computer.  Avoid any situation where there is potential for another head injury (football, hockey, soccer, basketball, martial arts, downhill snow sports and horseback riding). Your condition will get worse every time you experience a concussion. You should avoid these activities until you are evaluated by the appropriate follow-up caregivers. Returning To Your Regular Activities You will need to return to your normal activities slowly, not all at once. You must give your body and brain enough time for recovery.  Do not return to sports or other athletic activities until your health care provider tells you it is safe to do so.  Ask your health care provider when you can drive, ride a bicycle, or operate heavy machinery. Your ability to react may be slower after a brain injury. Never do these activities if you are dizzy.  Ask  your health care provider about when you can return to work or school. Preventing Another Concussion It is very important to avoid another brain injury, especially before you have recovered. In rare cases, another injury can lead to permanent brain damage, brain swelling, or death. The risk of this is greatest during the first 7 10 days after a head injury. Avoid injuries by:   Wearing a seat belt when riding in a car.   Drinking alcohol only in moderation.   Wearing a helmet when biking, skiing, skateboarding, skating, or doing similar activities.  Avoiding activities that could lead to a second concussion, such as contact or recreational sports, until your health care provider says it is OK.  Taking safety measures in your home.   Remove clutter and tripping hazards from floors and stairways.   Use grab bars in  bathrooms and handrails by stairs.   Place non-slip mats on floors and in bathtubs.   Improve lighting in dim areas. SEEK MEDICAL CARE IF:   You have increased problems paying attention or concentrating.   You have increased difficulty remembering or learning new information.   You need more time to complete tasks or assignments than before.   You have increased irritability or decreased ability to cope with stress.  You have more symptoms than before. Seek medical care if you have any of the following symptoms for more than 2 weeks after your injury:   Lasting (chronic) headaches.   Dizziness or balance problems.   Nausea.  Vision problems.   Increased sensitivity to noise or light.   Depression or mood swings.   Anxiety or irritability.   Memory problems.   Difficulty concentrating or paying attention.   Sleep problems.   Feeling tired all the time. SEEK IMMEDIATE MEDICAL CARE IF:   You have severe or worsening headaches. These may be a sign of a blood clot in the brain.  You have weakness (even if only in one hand, leg, or part of the face).  You have numbness.  You have decreased coordination.   You vomit repeatedly.  You have increased sleepiness.  One pupil is larger than the other.   You have convulsions.   You have slurred speech.   You have increased confusion. This may be a sign of a blood clot in the brain.  You have increased restlessness, agitation, or irritability.   You are unable to recognize people or places.   You have neck pain.   It is difficult to wake you up.   You have unusual behavior changes.   You lose consciousness. MAKE SURE YOU:   Understand these instructions.  Will watch your condition.  Will get help right away if you are not doing well or get worse. Document Released: 01/31/2004 Document Revised: 07/13/2013 Document Reviewed: 06/02/2013 [facility] Patient Information 2014 [place],  [place].

## 2014-02-14 NOTE — ED Notes (Signed)
Pt reports that she hit the back of her head on the trunk door on Saturday 3/21, pt states that she had a HA, but felt fine until that night when she drank 4 glasses of wine and became nauseated, vomited for 6 hours, was dizzy, and felt "foggy." Pt states that she then hit the back of her head on the headboard when going to bed that night. Pt states that she ran a 5K on Sunday and felt nauseated and foggy again. Pt states she drove her children to school on Monday and felt increasingly foggy with stiffness to her neck. Pt states that she was seen at Urgent care and told she needed at head CT but has not been able to schedule this yet, so she was told to come to the ED. Pt a&o x4, ambulatory to triage.

## 2014-02-15 NOTE — ED Provider Notes (Signed)
  Medical screening examination/treatment/procedure(s) were performed by non-physician practitioner and as supervising physician I was immediately available for consultation/collaboration.   EKG Interpretation None         Ilene Witcher, MD 02/15/14 1550 

## 2014-06-19 ENCOUNTER — Emergency Department (HOSPITAL_COMMUNITY)
Admission: EM | Admit: 2014-06-19 | Discharge: 2014-06-19 | Disposition: A | Discharge status: Home / Self Care | Payer: BC Managed Care – PPO | Attending: Emergency Medicine | Admitting: Emergency Medicine

## 2014-06-19 ENCOUNTER — Emergency Department (HOSPITAL_COMMUNITY): Payer: BC Managed Care – PPO

## 2014-06-19 ENCOUNTER — Encounter (HOSPITAL_COMMUNITY): Payer: Self-pay | Admitting: Emergency Medicine

## 2014-06-19 DIAGNOSIS — R12 Heartburn: Secondary | ICD-10-CM | POA: Insufficient documentation

## 2014-06-19 DIAGNOSIS — R110 Nausea: Secondary | ICD-10-CM | POA: Insufficient documentation

## 2014-06-19 DIAGNOSIS — Z8659 Personal history of other mental and behavioral disorders: Secondary | ICD-10-CM | POA: Insufficient documentation

## 2014-06-19 DIAGNOSIS — R079 Chest pain, unspecified: Secondary | ICD-10-CM | POA: Insufficient documentation

## 2014-06-19 DIAGNOSIS — E669 Obesity, unspecified: Secondary | ICD-10-CM | POA: Insufficient documentation

## 2014-06-19 DIAGNOSIS — Z79899 Other long term (current) drug therapy: Secondary | ICD-10-CM | POA: Insufficient documentation

## 2014-06-19 DIAGNOSIS — Z87448 Personal history of other diseases of urinary system: Secondary | ICD-10-CM | POA: Insufficient documentation

## 2014-06-19 DIAGNOSIS — R42 Dizziness and giddiness: Secondary | ICD-10-CM | POA: Insufficient documentation

## 2014-06-19 DIAGNOSIS — Z8719 Personal history of other diseases of the digestive system: Secondary | ICD-10-CM | POA: Insufficient documentation

## 2014-06-19 DIAGNOSIS — Z8679 Personal history of other diseases of the circulatory system: Secondary | ICD-10-CM | POA: Insufficient documentation

## 2014-06-19 DIAGNOSIS — Z91040 Latex allergy status: Secondary | ICD-10-CM | POA: Insufficient documentation

## 2014-06-19 DIAGNOSIS — Z880 Allergy status to penicillin: Secondary | ICD-10-CM | POA: Insufficient documentation

## 2014-06-19 DIAGNOSIS — J45901 Unspecified asthma with (acute) exacerbation: Secondary | ICD-10-CM | POA: Insufficient documentation

## 2014-06-19 LAB — BASIC METABOLIC PANEL
Anion gap: 12 (ref 5–15)
BUN: 13 mg/dL (ref 6–23)
CALCIUM: 9.3 mg/dL (ref 8.4–10.5)
CO2: 25 mEq/L (ref 19–32)
CREATININE: 0.85 mg/dL (ref 0.50–1.10)
Chloride: 104 mEq/L (ref 96–112)
GFR, EST NON AFRICAN AMERICAN: 86 mL/min — AB (ref >90)
Glucose, Bld: 110 mg/dL — ABNORMAL HIGH (ref 70–99)
POTASSIUM: 4.4 meq/L (ref 3.7–5.3)
Sodium: 141 mEq/L (ref 137–147)

## 2014-06-19 LAB — CBC
HCT: 45 % (ref 36.0–46.0)
Hemoglobin: 15.1 g/dL — ABNORMAL HIGH (ref 12.0–15.0)
MCH: 28.7 pg (ref 26.0–34.0)
MCHC: 33.6 g/dL (ref 30.0–36.0)
MCV: 85.4 fL (ref 78.0–100.0)
Platelets: 265 10*3/uL (ref 150–400)
RBC: 5.27 MIL/uL — ABNORMAL HIGH (ref 3.87–5.11)
RDW: 12.5 % (ref 11.5–15.5)
WBC: 6.9 10*3/uL (ref 4.0–10.5)

## 2014-06-19 LAB — I-STAT TROPONIN, ED: Troponin i, poc: 0 ng/mL (ref 0.00–0.08)

## 2014-06-19 LAB — PRO B NATRIURETIC PEPTIDE: Pro B Natriuretic peptide (BNP): 55.5 pg/mL (ref 0–125)

## 2014-06-19 MED ORDER — PANTOPRAZOLE SODIUM 20 MG PO TBEC: 20.0000 mg | DELAYED_RELEASE_TABLET | Freq: Every day | ORAL | Status: DC

## 2014-06-19 NOTE — ED Notes (Signed)
MD at bedside. 

## 2014-06-19 NOTE — Discharge Instructions (Signed)

## 2014-06-19 NOTE — ED Notes (Signed)
Pt reports 3 episodes of chest pain in last two weeks. One on Thursday 7/16, Saturday 7/23, and last episode today 7/27. Pt reports that she gets left sided chest pain that radiates to left shoulder and back, pain during episode 5/10 sharp, stabbing pain. Pt gets SOB, dizzy, diaphoretic and nausea. At present pt able to speak in full sentences. Pain has mostly resolved. Pain 1/10. Pt called pcp and was told to come to ED. Pt has hx of GERD and thinks this might be related to that but wanted to get checked out.

## 2014-06-19 NOTE — ED Notes (Signed)
Bed: WA21 Expected date:  Expected time:  Means of arrival:  Comments: 

## 2014-06-19 NOTE — ED Notes (Signed)
Bed: WA22 Expected date:  Expected time:  Means of arrival:  Comments: 

## 2014-06-19 NOTE — ED Notes (Signed)
Patient transported to X-ray 
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
## 2014-09-25 ENCOUNTER — Encounter (HOSPITAL_COMMUNITY): Payer: Self-pay | Admitting: Emergency Medicine

## 2015-05-30 ENCOUNTER — Emergency Department (HOSPITAL_COMMUNITY)
Admission: EM | Admit: 2015-05-30 | Discharge: 2015-05-30 | Disposition: A | Discharge status: Home / Self Care | Payer: Managed Care, Other (non HMO) | Attending: Emergency Medicine | Admitting: Emergency Medicine

## 2015-05-30 ENCOUNTER — Encounter (HOSPITAL_COMMUNITY): Payer: Self-pay | Admitting: Emergency Medicine

## 2015-05-30 DIAGNOSIS — Z8679 Personal history of other diseases of the circulatory system: Secondary | ICD-10-CM | POA: Insufficient documentation

## 2015-05-30 DIAGNOSIS — Z3202 Encounter for pregnancy test, result negative: Secondary | ICD-10-CM | POA: Diagnosis not present

## 2015-05-30 DIAGNOSIS — Z79899 Other long term (current) drug therapy: Secondary | ICD-10-CM | POA: Insufficient documentation

## 2015-05-30 DIAGNOSIS — Z880 Allergy status to penicillin: Secondary | ICD-10-CM | POA: Diagnosis not present

## 2015-05-30 DIAGNOSIS — F419 Anxiety disorder, unspecified: Secondary | ICD-10-CM | POA: Diagnosis not present

## 2015-05-30 DIAGNOSIS — Z8619 Personal history of other infectious and parasitic diseases: Secondary | ICD-10-CM | POA: Diagnosis not present

## 2015-05-30 DIAGNOSIS — E669 Obesity, unspecified: Secondary | ICD-10-CM | POA: Insufficient documentation

## 2015-05-30 DIAGNOSIS — Z87440 Personal history of urinary (tract) infections: Secondary | ICD-10-CM | POA: Diagnosis not present

## 2015-05-30 DIAGNOSIS — Z8742 Personal history of other diseases of the female genital tract: Secondary | ICD-10-CM | POA: Insufficient documentation

## 2015-05-30 DIAGNOSIS — Z8669 Personal history of other diseases of the nervous system and sense organs: Secondary | ICD-10-CM | POA: Diagnosis not present

## 2015-05-30 DIAGNOSIS — J45909 Unspecified asthma, uncomplicated: Secondary | ICD-10-CM | POA: Diagnosis not present

## 2015-05-30 DIAGNOSIS — K219 Gastro-esophageal reflux disease without esophagitis: Secondary | ICD-10-CM | POA: Insufficient documentation

## 2015-05-30 DIAGNOSIS — M545 Low back pain, unspecified: Secondary | ICD-10-CM

## 2015-05-30 DIAGNOSIS — F329 Major depressive disorder, single episode, unspecified: Secondary | ICD-10-CM | POA: Diagnosis not present

## 2015-05-30 LAB — COMPREHENSIVE METABOLIC PANEL
ALT: 22 U/L (ref 14–54)
AST: 17 U/L (ref 15–41)
Albumin: 4.1 g/dL (ref 3.5–5.0)
Alkaline Phosphatase: 76 U/L (ref 38–126)
Anion gap: 7 (ref 5–15)
BUN: 11 mg/dL (ref 6–20)
CO2: 24 mmol/L (ref 22–32)
Calcium: 8.7 mg/dL — ABNORMAL LOW (ref 8.9–10.3)
Chloride: 106 mmol/L (ref 101–111)
Creatinine, Ser: 0.94 mg/dL (ref 0.44–1.00)
GFR calc Af Amer: >60 mL/min (ref >60)
GFR calc non Af Amer: >60 mL/min (ref >60)
Glucose, Bld: 107 mg/dL — ABNORMAL HIGH (ref 65–99)
Potassium: 4.3 mmol/L (ref 3.5–5.1)
Sodium: 137 mmol/L (ref 135–145)
Total Bilirubin: 0.4 mg/dL (ref 0.3–1.2)
Total Protein: 7.1 g/dL (ref 6.5–8.1)

## 2015-05-30 LAB — URINE MICROSCOPIC-ADD ON

## 2015-05-30 LAB — CBC WITH DIFFERENTIAL/PLATELET
Basophils Absolute: 0 10*3/uL (ref 0.0–0.1)
Basophils Relative: 0 % (ref 0–1)
Eosinophils Absolute: 0.1 10*3/uL (ref 0.0–0.7)
Eosinophils Relative: 2 % (ref 0–5)
HCT: 42.9 % (ref 36.0–46.0)
Hemoglobin: 13.8 g/dL (ref 12.0–15.0)
Lymphocytes Relative: 28 % (ref 12–46)
Lymphs Abs: 1.9 10*3/uL (ref 0.7–4.0)
MCH: 28 pg (ref 26.0–34.0)
MCHC: 32.2 g/dL (ref 30.0–36.0)
MCV: 87.2 fL (ref 78.0–100.0)
Monocytes Absolute: 0.5 10*3/uL (ref 0.1–1.0)
Monocytes Relative: 7 % (ref 3–12)
Neutro Abs: 4.2 10*3/uL (ref 1.7–7.7)
Neutrophils Relative %: 63 % (ref 43–77)
Platelets: 248 10*3/uL (ref 150–400)
RBC: 4.92 MIL/uL (ref 3.87–5.11)
RDW: 12.8 % (ref 11.5–15.5)
WBC: 6.7 10*3/uL (ref 4.0–10.5)

## 2015-05-30 LAB — URINALYSIS, ROUTINE W REFLEX MICROSCOPIC
BILIRUBIN URINE: NEGATIVE
Glucose, UA: NEGATIVE mg/dL
Hgb urine dipstick: NEGATIVE
Ketones, ur: NEGATIVE mg/dL
Nitrite: NEGATIVE
PH: 6 (ref 5.0–8.0)
Protein, ur: NEGATIVE mg/dL
Specific Gravity, Urine: 1.027 (ref 1.005–1.030)
Urobilinogen, UA: 0.2 mg/dL (ref 0.0–1.0)

## 2015-05-30 LAB — POC URINE PREG, ED: PREG TEST UR: NEGATIVE

## 2015-05-30 LAB — LIPASE, BLOOD: Lipase: 19 U/L — ABNORMAL LOW (ref 22–51)

## 2015-05-30 NOTE — Discharge Instructions (Signed)
Back Exercises These exercises may help you when beginning to rehabilitate your injury. Your symptoms may resolve with or without further involvement from your physician, physical therapist or athletic trainer. While completing these exercises, remember:   Restoring tissue flexibility helps normal motion to return to the joints. This allows healthier, less painful movement and activity.  An effective stretch should be held for at least 30 seconds.  A stretch should never be painful. You should only feel a gentle lengthening or release in the stretched tissue. STRETCH - Extension, Prone on Elbows   Lie on your stomach on the floor, a bed will be too soft. Place your palms about shoulder width apart and at the height of your head.  Place your elbows under your shoulders. If this is too painful, stack pillows under your chest.  Allow your body to relax so that your hips drop lower and make contact more completely with the floor.  Hold this position for __________ seconds.  Slowly return to lying flat on the floor. Repeat __________ times. Complete this exercise __________ times per day.  RANGE OF MOTION - Extension, Prone Press Ups   Lie on your stomach on the floor, a bed will be too soft. Place your palms about shoulder width apart and at the height of your head.  Keeping your back as relaxed as possible, slowly straighten your elbows while keeping your hips on the floor. You may adjust the placement of your hands to maximize your comfort. As you gain motion, your hands will come more underneath your shoulders.  Hold this position __________ seconds.  Slowly return to lying flat on the floor. Repeat __________ times. Complete this exercise __________ times per day.  RANGE OF MOTION- Quadruped, Neutral Spine   Assume a hands and knees position on a firm surface. Keep your hands under your shoulders and your knees under your hips. You may place padding under your knees for  comfort.  Drop your head and point your tail bone toward the ground below you. This will round out your low back like an angry cat. Hold this position for __________ seconds.  Slowly lift your head and release your tail bone so that your back sags into a large arch, like an old horse.  Hold this position for __________ seconds.  Repeat this until you feel limber in your low back.  Now, find your "sweet spot." This will be the most comfortable position somewhere between the two previous positions. This is your neutral spine. Once you have found this position, tense your stomach muscles to support your low back.  Hold this position for __________ seconds. Repeat __________ times. Complete this exercise __________ times per day.  STRETCH - Flexion, Single Knee to Chest   Lie on a firm bed or floor with both legs extended in front of you.  Keeping one leg in contact with the floor, bring your opposite knee to your chest. Hold your leg in place by either grabbing behind your thigh or at your knee.  Pull until you feel a gentle stretch in your low back. Hold __________ seconds.  Slowly release your grasp and repeat the exercise with the opposite side. Repeat __________ times. Complete this exercise __________ times per day.  STRETCH - Hamstrings, Standing  Stand or sit and extend your right / left leg, placing your foot on a chair or foot stool  Keeping a slight arch in your low back and your hips straight forward.  Lead with your chest and   lean forward at the waist until you feel a gentle stretch in the back of your right / left knee or thigh. (When done correctly, this exercise requires leaning only a small distance.)  Hold this position for __________ seconds. Repeat __________ times. Complete this stretch __________ times per day. STRENGTHENING - Deep Abdominals, Pelvic Tilt   Lie on a firm bed or floor. Keeping your legs in front of you, bend your knees so they are both pointed  toward the ceiling and your feet are flat on the floor.  Tense your lower abdominal muscles to press your low back into the floor. This motion will rotate your pelvis so that your tail bone is scooping upwards rather than pointing at your feet or into the floor.  With a gentle tension and even breathing, hold this position for __________ seconds. Repeat __________ times. Complete this exercise __________ times per day.  STRENGTHENING - Abdominals, Crunches   Lie on a firm bed or floor. Keeping your legs in front of you, bend your knees so they are both pointed toward the ceiling and your feet are flat on the floor. Cross your arms over your chest.  Slightly tip your chin down without bending your neck.  Tense your abdominals and slowly lift your trunk high enough to just clear your shoulder blades. Lifting higher can put excessive stress on the low back and does not further strengthen your abdominal muscles.  Control your return to the starting position. Repeat __________ times. Complete this exercise __________ times per day.  STRENGTHENING - Quadruped, Opposite UE/LE Lift   Assume a hands and knees position on a firm surface. Keep your hands under your shoulders and your knees under your hips. You may place padding under your knees for comfort.  Find your neutral spine and gently tense your abdominal muscles so that you can maintain this position. Your shoulders and hips should form a rectangle that is parallel with the floor and is not twisted.  Keeping your trunk steady, lift your right hand no higher than your shoulder and then your left leg no higher than your hip. Make sure you are not holding your breath. Hold this position __________ seconds.  Continuing to keep your abdominal muscles tense and your back steady, slowly return to your starting position. Repeat with the opposite arm and leg. Repeat __________ times. Complete this exercise __________ times per day. Document Released:  11/28/2005 Document Revised: 02/02/2012 Document Reviewed: 02/22/2009 [employer] Patient Information 2015 [employer]. This information is not intended to replace advice given to you by your health care provider. Make sure you discuss any questions you have with your health care provider.  

## 2015-05-30 NOTE — ED Notes (Signed)
Pt reports mild tingling in left arm.
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
## 2015-05-30 NOTE — ED Notes (Signed)
Pt c/o left flank pain x5 days radiating to left rib cage, tender to touch, increased with deep breathing. Reports odor changes and urinary frequency. No known injury to same.

## 2015-05-30 NOTE — ED Notes (Signed)
EDP JEFF H at bedside.

## 2015-07-16 ENCOUNTER — Emergency Department (HOSPITAL_COMMUNITY)
Admission: EM | Admit: 2015-07-16 | Discharge: 2015-07-17 | Disposition: A | Discharge status: Home / Self Care | Payer: Managed Care, Other (non HMO) | Attending: Emergency Medicine | Admitting: Emergency Medicine

## 2015-07-16 DIAGNOSIS — Z8669 Personal history of other diseases of the nervous system and sense organs: Secondary | ICD-10-CM | POA: Insufficient documentation

## 2015-07-16 DIAGNOSIS — Z87448 Personal history of other diseases of urinary system: Secondary | ICD-10-CM | POA: Insufficient documentation

## 2015-07-16 DIAGNOSIS — K5900 Constipation, unspecified: Secondary | ICD-10-CM

## 2015-07-16 DIAGNOSIS — Z8742 Personal history of other diseases of the female genital tract: Secondary | ICD-10-CM | POA: Diagnosis not present

## 2015-07-16 DIAGNOSIS — Z8659 Personal history of other mental and behavioral disorders: Secondary | ICD-10-CM | POA: Insufficient documentation

## 2015-07-16 DIAGNOSIS — Z880 Allergy status to penicillin: Secondary | ICD-10-CM | POA: Insufficient documentation

## 2015-07-16 DIAGNOSIS — K219 Gastro-esophageal reflux disease without esophagitis: Secondary | ICD-10-CM | POA: Insufficient documentation

## 2015-07-16 DIAGNOSIS — R1030 Lower abdominal pain, unspecified: Secondary | ICD-10-CM | POA: Diagnosis present

## 2015-07-16 DIAGNOSIS — E669 Obesity, unspecified: Secondary | ICD-10-CM | POA: Insufficient documentation

## 2015-07-16 DIAGNOSIS — Z79899 Other long term (current) drug therapy: Secondary | ICD-10-CM | POA: Diagnosis not present

## 2015-07-16 DIAGNOSIS — Z3202 Encounter for pregnancy test, result negative: Secondary | ICD-10-CM | POA: Diagnosis not present

## 2015-07-16 DIAGNOSIS — IMO0001 Reserved for inherently not codable concepts without codable children: Secondary | ICD-10-CM

## 2015-07-16 DIAGNOSIS — Z8679 Personal history of other diseases of the circulatory system: Secondary | ICD-10-CM | POA: Diagnosis not present

## 2015-07-16 DIAGNOSIS — N390 Urinary tract infection, site not specified: Secondary | ICD-10-CM | POA: Diagnosis not present

## 2015-07-16 DIAGNOSIS — Z8619 Personal history of other infectious and parasitic diseases: Secondary | ICD-10-CM | POA: Insufficient documentation

## 2015-07-16 DIAGNOSIS — J45909 Unspecified asthma, uncomplicated: Secondary | ICD-10-CM | POA: Insufficient documentation

## 2015-07-17 ENCOUNTER — Encounter (HOSPITAL_COMMUNITY): Payer: Self-pay | Admitting: Emergency Medicine

## 2015-07-17 ENCOUNTER — Emergency Department (HOSPITAL_COMMUNITY): Payer: Managed Care, Other (non HMO)

## 2015-07-17 LAB — URINALYSIS, ROUTINE W REFLEX MICROSCOPIC
Bilirubin Urine: NEGATIVE
GLUCOSE, UA: NEGATIVE mg/dL
Hgb urine dipstick: NEGATIVE
Ketones, ur: NEGATIVE mg/dL
Nitrite: NEGATIVE
PH: 6 (ref 5.0–8.0)
Protein, ur: NEGATIVE mg/dL
Specific Gravity, Urine: 1.019 (ref 1.005–1.030)
Urobilinogen, UA: 1 mg/dL (ref 0.0–1.0)

## 2015-07-17 LAB — PREGNANCY, URINE: Preg Test, Ur: NEGATIVE

## 2015-07-17 LAB — URINE MICROSCOPIC-ADD ON

## 2015-07-17 MED ORDER — NITROFURANTOIN MONOHYD MACRO 100 MG PO CAPS
100.0000 mg | ORAL_CAPSULE | Freq: Once | ORAL | Status: AC
  Administered 2015-07-17: 100 mg via ORAL
  Filled 2015-07-17: qty 1

## 2015-07-17 MED ORDER — DICYCLOMINE HCL 10 MG/ML IM SOLN
20.0000 mg | Freq: Once | INTRAMUSCULAR | Status: AC
  Administered 2015-07-17: 20 mg via INTRAMUSCULAR
  Filled 2015-07-17: qty 2

## 2015-07-17 MED ORDER — POLYETHYLENE GLYCOL 3350 17 GM/SCOOP PO POWD: 1.0000 | Freq: Once | ORAL | Status: DC

## 2015-07-17 MED ORDER — GI COCKTAIL ~~LOC~~
30.0000 mL | Freq: Once | ORAL | Status: AC
  Administered 2015-07-17: 30 mL via ORAL
  Filled 2015-07-17: qty 30

## 2015-07-17 MED ORDER — NITROFURANTOIN MONOHYD MACRO 100 MG PO CAPS
100.0000 mg | ORAL_CAPSULE | Freq: Two times a day (BID) | ORAL | Status: DC
Start: 2015-07-17 — End: 2016-02-14

## 2015-07-17 MED ORDER — OMEPRAZOLE 20 MG PO CPDR: 20.0000 mg | DELAYED_RELEASE_CAPSULE | Freq: Every day | ORAL | Status: DC

## 2015-07-17 MED ORDER — KETOROLAC TROMETHAMINE 60 MG/2ML IM SOLN
60.0000 mg | Freq: Once | INTRAMUSCULAR | Status: AC
  Administered 2015-07-17: 60 mg via INTRAMUSCULAR
  Filled 2015-07-17: qty 2

## 2015-07-17 NOTE — ED Notes (Signed)
Pt states she has been having left pain since around 8/19 and rib pain since early July (hich was diagnosed as a pulled muscle). Pt reports her urine has had sediment and had a bad  to it. Pt states the pain from her lower back has spread to her groin and she experiences rebound tenderness on the left side that spreads to her entire lower abdomen following palpation. She has some vaginal discharge, but she states this is normal for her during this time of her menstrual cycle.

## 2015-07-17 NOTE — ED Notes (Signed)
Pt reports abdominal and lower black pain with burning and pain to urinate. No blood in the urine. Hx of UTI in the past.

## 2015-07-17 NOTE — ED Notes (Signed)
Patient transported to X-ray 
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
## 2015-12-04 ENCOUNTER — Other Ambulatory Visit (HOSPITAL_COMMUNITY): Payer: Self-pay | Admitting: Orthopedic Surgery

## 2015-12-04 ENCOUNTER — Ambulatory Visit (HOSPITAL_COMMUNITY)
Admission: RE | Admit: 2015-12-04 | Discharge: 2015-12-04 | Disposition: A | Discharge status: Home / Self Care | Payer: Managed Care, Other (non HMO) | Source: Ambulatory Visit | Attending: Cardiology | Admitting: Cardiology

## 2015-12-04 DIAGNOSIS — M79604 Pain in right leg: Secondary | ICD-10-CM

## 2016-01-23 ENCOUNTER — Other Ambulatory Visit: Payer: Self-pay

## 2016-01-23 DIAGNOSIS — Z1231 Encounter for screening mammogram for malignant neoplasm of breast: Secondary | ICD-10-CM

## 2016-01-27 ENCOUNTER — Ambulatory Visit (INDEPENDENT_AMBULATORY_CARE_PROVIDER_SITE_OTHER): Payer: Managed Care, Other (non HMO)

## 2016-01-27 ENCOUNTER — Ambulatory Visit (INDEPENDENT_AMBULATORY_CARE_PROVIDER_SITE_OTHER): Payer: Managed Care, Other (non HMO) | Admitting: Internal Medicine

## 2016-01-27 VITALS — BP 120/80 | HR 83 | Temp 98.4°F | Resp 18 | Ht 66.5 in | Wt 249.0 lb

## 2016-01-27 DIAGNOSIS — J4521 Mild intermittent asthma with (acute) exacerbation: Secondary | ICD-10-CM | POA: Diagnosis not present

## 2016-01-27 DIAGNOSIS — R05 Cough: Secondary | ICD-10-CM | POA: Diagnosis not present

## 2016-01-27 DIAGNOSIS — R059 Cough, unspecified: Secondary | ICD-10-CM

## 2016-01-27 MED ORDER — ALBUTEROL SULFATE (2.5 MG/3ML) 0.083% IN NEBU
2.5000 mg | INHALATION_SOLUTION | Freq: Once | RESPIRATORY_TRACT | Status: AC
  Administered 2016-01-27: 2.5 mg via RESPIRATORY_TRACT

## 2016-01-27 MED ORDER — METHYLPREDNISOLONE ACETATE 80 MG/ML IJ SUSP
80.0000 mg | Freq: Once | INTRAMUSCULAR | Status: AC
Start: 2016-01-27 — End: 2016-01-27
  Administered 2016-01-27: 80 mg via INTRAMUSCULAR

## 2016-01-27 MED ORDER — AZITHROMYCIN 500 MG PO TABS: 500.0000 mg | ORAL_TABLET | Freq: Every day | ORAL | Status: DC

## 2016-01-27 MED ORDER — ALBUTEROL SULFATE HFA 108 (90 BASE) MCG/ACT IN AERS
2.0000 | INHALATION_SPRAY | Freq: Four times a day (QID) | RESPIRATORY_TRACT | Status: AC | PRN
Start: 2016-01-27 — End: ?

## 2016-01-27 MED ORDER — ALBUTEROL SULFATE (2.5 MG/3ML) 0.083% IN NEBU
2.5000 mg | INHALATION_SOLUTION | Freq: Four times a day (QID) | RESPIRATORY_TRACT | Status: AC | PRN
Start: 2016-01-27 — End: ?

## 2016-01-27 NOTE — Progress Notes (Signed)
Subjective:    Patient ID: Gina Schneider, female    DOB: 10-Sep-1976, 40 y.o.   MRN: [id number] By signing my name below, I, Gina Schneider, attest that this documentation has been prepared under the direction and in the presence of Gina Sia, MD.  Electronically Signed: Littie Schneider, Medical Scribe. 01/27/2016. 10:44 AM.  HPI HPI Comments: Gina Schneider is a 40 y.o. female with a history of asthma (childhood and allergy/seasonally-induced) who presents to the [facility] complaining of gradual onset, productive cough of yellow sputum that started about a month ago. Patient notes she has had sinus and allergy symptoms for the past month. Over the past 1-2 days, she began having chest tightness. She has also had sharp pain behind her right shoulder blade and night sweats. She used her inhaler last night with some relief. Patient denies possibility of pregnancy.  Patient Active Problem List   Diagnosis Date Noted  . VBAC (vaginal birth after Cesarean) 04/13/2012  . Vaginal delivery 02/07/2012  . Perineal laceration 02/07/2012  . Obesity 02/05/2012  . AMA (advanced maternal age) multigravida 35+ 02/05/2012  . Migraine 02/05/2012  . Anxiety 02/05/2012  . Cervical insufficiency in pregnancy, antepartum 09/25/2011  . CELLULITIS AND ABSCESS OF UPPER ARM AND FOREARM 07/16/2011  . TIC 01/15/2011  . DYSURIA 01/15/2011     Review of Systems  Constitutional: Negative for unexpected weight change.  HENT: Negative for ear pain, sore throat and trouble swallowing.   Eyes: Negative for photophobia and visual disturbance.  Gastrointestinal: Negative for abdominal pain.  Genitourinary: Negative for difficulty urinating and menstrual problem.  Neurological: Negative for light-headedness and headaches.       Objective:   Physical Exam  Constitutional: She is oriented to person, place, and time. She appears well-developed and well-nourished. No distress.  HENT:  Head:  Normocephalic and atraumatic.  Nose: Nose normal.  Mouth/Throat: Oropharynx is clear and moist. No oropharyngeal exudate.  Eyes: Conjunctivae and EOM are normal. Pupils are equal, round, and reactive to light.  Neck: Neck supple.  Cardiovascular: Normal rate, regular rhythm, normal heart sounds and intact distal pulses.   No murmur heard. Pulmonary/Chest: Effort normal. She has wheezes. She has rhonchi.  Scattered rhonchi with wheezing on forced expiration.  Musculoskeletal: She exhibits no edema.  Lymphadenopathy:    She has no cervical adenopathy.  Neurological: She is alert and oriented to person, place, and time. No cranial nerve deficit.  Skin: Skin is warm and dry. No rash noted.  Psychiatric: She has a normal mood and affect. Her behavior is normal.  Nursing note and vitals reviewed. BP 120/80 mmHg  Pulse 83  Temp(Src) 98.4 F (36.9 C) (Oral)  Resp 18  Ht 5' 6.5" (1.689 m)  Wt 249 lb (112.946 kg)  BMI 39.59 kg/m2  SpO2 99%  LMP 01/08/2016 (Approximate)  [facility] (PRIMARY) x-ray report read by Dr. Merla Riches: CXR - clear       Assessment & Plan:  Cough - Plan: DG Chest 2 View  Asthma, mild intermittent, with acute exacerbation - Plan: methylPREDNISolone acetate (DEPO-MEDROL) injection 80 mg, albuterol (PROVENTIL) (2.5 MG/3ML) 0.083% nebulizer solution 2.5 mg  Meds ordered this encounter  Medications  . methylPREDNISolone acetate (DEPO-MEDROL) injection 80 mg    Sig:   . albuterol (PROVENTIL) (2.5 MG/3ML) 0.083% nebulizer solution 2.5 mg    Sig:   . azithromycin (ZITHROMAX) 500 MG tablet    Sig: Take 1 tablet (500 mg total) by mouth daily. For 3  days    Dispense:  3 tablet    Refill:  0  . albuterol (PROVENTIL HFA;VENTOLIN HFA) 108 (90 Base) MCG/ACT inhaler    Sig: Inhale 2 puffs into the lungs every 6 (six) hours as needed for wheezing or shortness of breath.    Dispense:  1 Inhaler    Refill:  2  . albuterol (PROVENTIL) (2.5 MG/3ML) 0.083% nebulizer solution     Sig: Take 3 mLs (2.5 mg total) by nebulization every 6 (six) hours as needed for wheezing or shortness of breath.    Dispense:  150 mL    Refill:  1   Needs pcp fu for BMI  I have completed the patient encounter in its entirety as documented by the scribe, with editing by me where necessary. Gina Schneider P. Merla Richesoolittle, M.D.

## 2016-01-27 NOTE — Patient Instructions (Signed)
Because you received an x-ray today, you will receive an invoice from [facility]. Please contact [facility] at [phone] with questions or concerns regarding your invoice. Our billing staff will not be able to assist you with those questions. °

## 2016-02-14 ENCOUNTER — Encounter (HOSPITAL_COMMUNITY): Payer: Self-pay | Admitting: *Deleted

## 2016-02-14 ENCOUNTER — Inpatient Hospital Stay (HOSPITAL_COMMUNITY)
Admission: AD | Admit: 2016-02-14 | Discharge: 2016-02-14 | Disposition: A | Discharge status: Home / Self Care | Payer: Managed Care, Other (non HMO) | Source: Ambulatory Visit | Attending: Obstetrics and Gynecology | Admitting: Obstetrics and Gynecology

## 2016-02-14 DIAGNOSIS — J45909 Unspecified asthma, uncomplicated: Secondary | ICD-10-CM | POA: Insufficient documentation

## 2016-02-14 DIAGNOSIS — K219 Gastro-esophageal reflux disease without esophagitis: Secondary | ICD-10-CM | POA: Diagnosis not present

## 2016-02-14 DIAGNOSIS — N8110 Cystocele, unspecified: Secondary | ICD-10-CM | POA: Insufficient documentation

## 2016-02-14 DIAGNOSIS — Z3202 Encounter for pregnancy test, result negative: Secondary | ICD-10-CM | POA: Insufficient documentation

## 2016-02-14 DIAGNOSIS — N899 Noninflammatory disorder of vagina, unspecified: Secondary | ICD-10-CM | POA: Diagnosis present

## 2016-02-14 DIAGNOSIS — Z880 Allergy status to penicillin: Secondary | ICD-10-CM | POA: Insufficient documentation

## 2016-02-14 LAB — URINALYSIS, ROUTINE W REFLEX MICROSCOPIC
Bilirubin Urine: NEGATIVE
GLUCOSE, UA: NEGATIVE mg/dL
HGB URINE DIPSTICK: NEGATIVE
Ketones, ur: NEGATIVE mg/dL
Leukocytes, UA: NEGATIVE
Nitrite: NEGATIVE
PH: 7 (ref 5.0–8.0)
Protein, ur: NEGATIVE mg/dL
SPECIFIC GRAVITY, URINE: 1.01 (ref 1.005–1.030)

## 2016-02-14 LAB — WET PREP, GENITAL
Clue Cells Wet Prep HPF POC: NONE SEEN
SPERM: NONE SEEN
Trich, Wet Prep: NONE SEEN
Yeast Wet Prep HPF POC: NONE SEEN

## 2016-02-14 LAB — POCT PREGNANCY, URINE: Preg Test, Ur: NEGATIVE

## 2016-02-14 NOTE — Discharge Instructions (Signed)
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
## 2016-02-14 NOTE — MAU Note (Signed)
Patient presents stating she is not pregnant with c/o a bulging mass at the opening of her vagina that she noticed last night and states she has pain in her lower right back around to her abdomen since last week. Denies bleeding or discharge.

## 2016-02-25 ENCOUNTER — Inpatient Hospital Stay: Admission: RE | Admit: 2016-02-25 | Payer: Managed Care, Other (non HMO) | Source: Ambulatory Visit

## 2016-02-27 ENCOUNTER — Institutional Professional Consult (permissible substitution): Payer: Managed Care, Other (non HMO) | Admitting: Pulmonary Disease

## 2016-02-28 ENCOUNTER — Other Ambulatory Visit: Payer: Self-pay

## 2016-04-10 ENCOUNTER — Telehealth: Payer: Self-pay

## 2016-04-10 ENCOUNTER — Ambulatory Visit (INDEPENDENT_AMBULATORY_CARE_PROVIDER_SITE_OTHER): Payer: Managed Care, Other (non HMO) | Admitting: Family Medicine

## 2016-04-10 VITALS — BP 114/86 | HR 73 | Temp 98.6°F | Resp 18 | Ht 66.5 in | Wt 253.6 lb

## 2016-04-10 DIAGNOSIS — J029 Acute pharyngitis, unspecified: Secondary | ICD-10-CM | POA: Diagnosis not present

## 2016-04-10 DIAGNOSIS — R519 Headache, unspecified: Secondary | ICD-10-CM

## 2016-04-10 DIAGNOSIS — R61 Generalized hyperhidrosis: Secondary | ICD-10-CM

## 2016-04-10 DIAGNOSIS — J019 Acute sinusitis, unspecified: Secondary | ICD-10-CM

## 2016-04-10 DIAGNOSIS — R51 Headache: Secondary | ICD-10-CM | POA: Diagnosis not present

## 2016-04-10 DIAGNOSIS — M542 Cervicalgia: Secondary | ICD-10-CM

## 2016-04-10 DIAGNOSIS — R6883 Chills (without fever): Secondary | ICD-10-CM

## 2016-04-10 DIAGNOSIS — Z8619 Personal history of other infectious and parasitic diseases: Secondary | ICD-10-CM | POA: Diagnosis not present

## 2016-04-10 LAB — POCT CBC
GRANULOCYTE PERCENT: 64.6 % (ref 37–80)
HCT, POC: 41.2 % (ref 37.7–47.9)
HEMOGLOBIN: 14.3 g/dL (ref 12.2–16.2)
Lymph, poc: 2.5 (ref 0.6–3.4)
MCH: 29.2 pg (ref 27–31.2)
MCHC: 34.8 g/dL (ref 31.8–35.4)
MCV: 83.7 fL (ref 80–97)
MID (cbc): 0.9 (ref 0–0.9)
MPV: 8.3 fL (ref 0–99.8)
PLATELET COUNT, POC: 257 10*3/uL (ref 142–424)
POC Granulocyte: 6.2 (ref 2–6.9)
POC LYMPH PERCENT: 25.9 %L (ref 10–50)
POC MID %: 9.5 % (ref 0–12)
RBC: 4.92 M/uL (ref 4.04–5.48)
RDW, POC: 13.2 %
WBC: 9.6 10*3/uL (ref 4.6–10.2)

## 2016-04-10 LAB — POCT RAPID STREP A (OFFICE): Rapid Strep A Screen: NEGATIVE

## 2016-04-10 MED ORDER — CYCLOBENZAPRINE HCL 5 MG PO TABS: ORAL_TABLET | ORAL | Status: DC

## 2016-04-10 MED ORDER — LEVOFLOXACIN 500 MG PO TABS: 500.0000 mg | ORAL_TABLET | Freq: Every day | ORAL | Status: DC

## 2016-04-10 MED ORDER — FLUCONAZOLE 150 MG PO TABS: 150.0000 mg | ORAL_TABLET | Freq: Once | ORAL | Status: DC

## 2016-04-10 NOTE — Telephone Encounter (Signed)
Pt left without her Diflucan called pt and she asked if we can fax it, so the fax was sent to her pharmacy

## 2016-04-10 NOTE — Patient Instructions (Addendum)
IF you received an x-ray today, you will receive an invoice from [facility]. Please contact [facility] at [phone] with questions or concerns regarding your invoice.   IF you received labwork today, you will receive an invoice from [facility]. Please contact [facility] at [phone] with questions or concerns regarding your invoice.   Our billing staff will not be able to assist you with questions regarding bills from these companies.  You will be contacted with the lab results as soon as they are available. The fastest way to get your results is to activate your My Chart account. Instructions are located on the last page of this paperwork. If you have not heard from us regarding the results in 2 weeks, please contact this office.     Your blood count and strep test in the office were normal, but I did send off other virus tests and throat culture. Your symptoms may be due to a sinusitis. Start Levaquin 1 per day, saline or salt water nasal spray 4-5 times per day if possible, Tylenol as needed for headache, and Flexeril as needed at bedtime for neck soreness. If you're not improving in the next 4-5 days, or any worsening sooner, return for recheck.  Sinusitis, Adult Sinusitis is redness, soreness, and inflammation of the paranasal sinuses. Paranasal sinuses are air pockets within the bones of your face. They are located beneath your eyes, in the middle of your forehead, and above your eyes. In healthy paranasal sinuses, mucus is able to drain out, and air is able to circulate through them by way of your nose. However, when your paranasal sinuses are inflamed, mucus and air can become trapped. This can allow bacteria and other germs to grow and cause infection. Sinusitis can develop quickly and last only a short time (acute) or continue over a long period (chronic). Sinusitis that lasts for more than 12 weeks is considered  chronic. CAUSES Causes of sinusitis include:  Allergies.  Structural abnormalities, such as displacement of the cartilage that separates your nostrils (deviated septum), which can decrease the air flow through your nose and sinuses and affect sinus drainage.  Functional abnormalities, such as when the small hairs (cilia) that line your sinuses and help remove mucus do not work properly or are not present. SIGNS AND SYMPTOMS Symptoms of acute and chronic sinusitis are the same. The primary symptoms are pain and pressure around the affected sinuses. Other symptoms include:  Upper toothache.  Earache.  Headache.  Bad breath.  Decreased sense of smell and taste.  A cough, which worsens when you are lying flat.  Fatigue.  Fever.  Thick drainage from your nose, which often is green and may contain pus (purulent).  Swelling and warmth over the affected sinuses. DIAGNOSIS Your health care provider will perform a physical exam. During your exam, your health care provider may perform any of the following to help determine if you have acute sinusitis or chronic sinusitis:  Look in your nose for signs of abnormal growths in your nostrils (nasal polyps).  Tap over the affected sinus to check for signs of infection.  View the inside of your sinuses using an imaging device that has a light attached (endoscope). If your health care provider suspects that you have chronic sinusitis, one or more of the following tests may be recommended:  Allergy tests.  Nasal culture. A sample of mucus is taken from your nose, sent to a lab, and screened for bacteria.  Nasal  cytology. A sample of mucus is taken from your nose and examined by your health care provider to determine if your sinusitis is related to an allergy. TREATMENT Most cases of acute sinusitis are related to a viral infection and will resolve on their own within 10 days. Sometimes, medicines are prescribed to help relieve symptoms of  both acute and chronic sinusitis. These may include pain medicines, decongestants, nasal steroid sprays, or saline sprays. However, for sinusitis related to a bacterial infection, your health care provider will prescribe antibiotic medicines. These are medicines that will help kill the bacteria causing the infection. Rarely, sinusitis is caused by a fungal infection. In these cases, your health care provider will prescribe antifungal medicine. For some cases of chronic sinusitis, surgery is needed. Generally, these are cases in which sinusitis recurs more than 3 times per year, despite other treatments. HOME CARE INSTRUCTIONS  Drink plenty of water. Water helps thin the mucus so your sinuses can drain more easily.  Use a humidifier.  Inhale steam 3-4 times a day (for example, sit in the bathroom with the shower running).  Apply a warm, moist washcloth to your face 3-4 times a day, or as directed by your health care provider.  Use saline nasal sprays to help moisten and clean your sinuses.  Take medicines only as directed by your health care provider.  If you were prescribed either an antibiotic or antifungal medicine, finish it all even if you start to feel better. SEEK IMMEDIATE MEDICAL CARE IF:  You have increasing pain or severe headaches.  You have nausea, vomiting, or drowsiness.  You have swelling around your face.  You have vision problems.  You have a stiff neck.  You have difficulty breathing.   This information is not intended to replace advice given to you by your health care provider. Make sure you discuss any questions you have with your health care provider.   Document Released: 11/10/2005 Document Revised: 12/01/2014 Document Reviewed: 11/25/2011 Elsevier Interactive Patient Education [employer]
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
## 2016-04-11 LAB — EPSTEIN-BARR VIRUS VCA ANTIBODY PANEL
EBV EA IgG: 42.6 U/mL — ABNORMAL HIGH (ref <9.0)
EBV NA IGG: 406 U/mL — AB (ref <18.0)
EBV VCA IGG: 473 U/mL — AB (ref <18.0)
EBV VCA IgM: <10 U/mL (ref <36.0)

## 2016-04-11 LAB — CMV IGM

## 2016-04-12 LAB — CULTURE, GROUP A STREP

## 2016-04-25 ENCOUNTER — Ambulatory Visit: Payer: Managed Care, Other (non HMO)

## 2016-04-29 ENCOUNTER — Other Ambulatory Visit: Payer: Self-pay | Admitting: Family Medicine

## 2016-05-02 ENCOUNTER — Ambulatory Visit: Payer: Managed Care, Other (non HMO)

## 2016-05-09 ENCOUNTER — Ambulatory Visit
Admission: RE | Admit: 2016-05-09 | Discharge: 2016-05-09 | Disposition: A | Discharge status: Home / Self Care | Payer: Managed Care, Other (non HMO) | Source: Ambulatory Visit

## 2016-05-09 DIAGNOSIS — Z1231 Encounter for screening mammogram for malignant neoplasm of breast: Secondary | ICD-10-CM

## 2016-05-14 ENCOUNTER — Ambulatory Visit: Payer: Managed Care, Other (non HMO)

## 2016-10-30 ENCOUNTER — Ambulatory Visit (INDEPENDENT_AMBULATORY_CARE_PROVIDER_SITE_OTHER): Payer: Managed Care, Other (non HMO) | Admitting: Pulmonary Disease

## 2016-10-30 ENCOUNTER — Ambulatory Visit (INDEPENDENT_AMBULATORY_CARE_PROVIDER_SITE_OTHER)
Admission: RE | Admit: 2016-10-30 | Discharge: 2016-10-30 | Disposition: A | Discharge status: Home / Self Care | Payer: Managed Care, Other (non HMO) | Source: Ambulatory Visit | Attending: Pulmonary Disease | Admitting: Pulmonary Disease

## 2016-10-30 ENCOUNTER — Encounter: Payer: Self-pay | Admitting: Pulmonary Disease

## 2016-10-30 VITALS — BP 122/80 | HR 75 | Ht 66.5 in | Wt 258.8 lb

## 2016-10-30 DIAGNOSIS — R059 Cough, unspecified: Secondary | ICD-10-CM

## 2016-10-30 DIAGNOSIS — R05 Cough: Secondary | ICD-10-CM

## 2016-10-30 DIAGNOSIS — K219 Gastro-esophageal reflux disease without esophagitis: Secondary | ICD-10-CM

## 2016-10-30 DIAGNOSIS — J45909 Unspecified asthma, uncomplicated: Secondary | ICD-10-CM

## 2016-10-30 DIAGNOSIS — R0602 Shortness of breath: Secondary | ICD-10-CM | POA: Diagnosis not present

## 2016-10-30 LAB — NITRIC OXIDE

## 2016-10-30 MED ORDER — BECLOMETHASONE DIPROPIONATE 80 MCG/ACT IN AERS: 2.0000 | INHALATION_SPRAY | Freq: Two times a day (BID) | RESPIRATORY_TRACT | 6 refills | Status: AC

## 2016-10-30 MED ORDER — BECLOMETHASONE DIPROPIONATE 80 MCG/ACT IN AERS: 2.0000 | INHALATION_SPRAY | Freq: Two times a day (BID) | RESPIRATORY_TRACT | 2 refills | Status: DC

## 2016-10-30 MED ORDER — PANTOPRAZOLE SODIUM 40 MG PO TBEC: 40.0000 mg | DELAYED_RELEASE_TABLET | Freq: Every day | ORAL | 5 refills | Status: DC
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
## 2016-10-30 NOTE — Assessment & Plan Note (Signed)
Protonix Lifestyle modifications

## 2016-10-30 NOTE — Patient Instructions (Signed)
For your asthma: Take Qvar 2 puffs twice a day No matter how you feel We will arrange for a spirometry test here in the office as well as an exhaled nitric oxide test We will also arrange for a chest x-ray  Her your hoarseness and cough, I believe this is due to acid reflux: Followed the acid reflux lifestyle modification sheet we gave you Take over-the-counter Pepcid twice a day You need to try to suppress your cough to allow your larynx (voice box) to heal.  For three days don't talk, laugh, sing, or clear your throat. Do everything you can to suppress the cough during this time. Use hard candies (sugarless Jolly Ranchers) or non-mint or non-menthol containing cough drops during this time to soothe your throat.  Use a cough suppressant (Delsym or what I have prescribed you) around the clock during this time.  After three days, gradually increase the use of your voice and back off on the cough suppressants.  We will see you back in 4 weeks or sooner if needed

## 2016-10-30 NOTE — Addendum Note (Signed)
Addended by: Maurene CapesPOTTS, Nasra Counce M on: 10/30/2016 01:31 PM   Modules accepted: Orders

## 2016-10-30 NOTE — Assessment & Plan Note (Signed)
She has a history of lifelong asthma symptoms and her recent constellation of findings could absolutely be explained by recurrence of asthma in the setting of a severe upper respiratory infection. Unfortunately there is no perfect test for asthma and the tests we did today (exhaled nitric oxide and simple spirometry) were not suggestive of eosinophilic inflammation in the airways or airflow obstruction. While this is a good finding overall, I still worry that the possibility of recurrent asthma exists.  The differential diagnosis would include symptoms of chest tightness, shortness of breath and cough related to laryngeal irritation or less likely some slow to resolve pneumonia or inflammatory pulmonary process (bronchitis, pneumonitis, etc).  Plan: Add inhaled corticosteroid Chest x-ray Consider methacholine challenge next visit Continue albuterol as needed

## 2016-10-30 NOTE — Assessment & Plan Note (Signed)
I believe that her cough is primarily related to hoarseness in the setting of acid reflux contributing to laryngeal irritation. 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 She also has postnasal drip but she says this is been fairly well controlled in the last year.  Plan: Voice rest was strongly encourage Treat acid reflux with protonic's and lifestyle modifications If no improvement by the next visit then referred to ENT to consider repeat laryngoscopy considering her history of laryngeal polyps

## 2017-04-04 ENCOUNTER — Emergency Department (HOSPITAL_COMMUNITY)
Admission: EM | Admit: 2017-04-04 | Discharge: 2017-04-04 | Disposition: A | Discharge status: Home / Self Care | Payer: Commercial Managed Care - PPO | Attending: Emergency Medicine | Admitting: Emergency Medicine

## 2017-04-04 ENCOUNTER — Emergency Department (HOSPITAL_COMMUNITY): Payer: Commercial Managed Care - PPO

## 2017-04-04 ENCOUNTER — Encounter (HOSPITAL_COMMUNITY): Payer: Self-pay | Admitting: Emergency Medicine

## 2017-04-04 DIAGNOSIS — J45909 Unspecified asthma, uncomplicated: Secondary | ICD-10-CM | POA: Diagnosis not present

## 2017-04-04 DIAGNOSIS — R072 Precordial pain: Secondary | ICD-10-CM | POA: Insufficient documentation

## 2017-04-04 DIAGNOSIS — R079 Chest pain, unspecified: Secondary | ICD-10-CM

## 2017-04-04 DIAGNOSIS — Z79899 Other long term (current) drug therapy: Secondary | ICD-10-CM | POA: Insufficient documentation

## 2017-04-04 LAB — CBC
HCT: 42.6 % (ref 36.0–46.0)
HEMOGLOBIN: 14.1 g/dL (ref 12.0–15.0)
MCH: 28.2 pg (ref 26.0–34.0)
MCHC: 33.1 g/dL (ref 30.0–36.0)
MCV: 85.2 fL (ref 78.0–100.0)
PLATELETS: 306 10*3/uL (ref 150–400)
RBC: 5 MIL/uL (ref 3.87–5.11)
RDW: 13.3 % (ref 11.5–15.5)
WBC: 9.1 10*3/uL (ref 4.0–10.5)

## 2017-04-04 LAB — BASIC METABOLIC PANEL
ANION GAP: 8 (ref 5–15)
BUN: 13 mg/dL (ref 6–20)
CALCIUM: 8.7 mg/dL — AB (ref 8.9–10.3)
CHLORIDE: 105 mmol/L (ref 101–111)
CO2: 24 mmol/L (ref 22–32)
Creatinine, Ser: 0.83 mg/dL (ref 0.44–1.00)
GFR calc Af Amer: >60 mL/min (ref >60)
GFR calc non Af Amer: >60 mL/min (ref >60)
Glucose, Bld: 96 mg/dL (ref 65–99)
Potassium: 3.8 mmol/L (ref 3.5–5.1)
SODIUM: 137 mmol/L (ref 135–145)

## 2017-04-04 LAB — I-STAT TROPONIN, ED
TROPONIN I, POC: 0 ng/mL (ref 0.00–0.08)
Troponin i, poc: 0 ng/mL (ref 0.00–0.08)

## 2017-04-04 LAB — I-STAT BETA HCG BLOOD, ED (MC, WL, AP ONLY)

## 2017-04-04 LAB — D-DIMER, QUANTITATIVE: D-Dimer, Quant: <0.27 ug/mL-FEU (ref 0.00–0.50)

## 2017-04-04 MED ORDER — GI COCKTAIL ~~LOC~~
30.0000 mL | Freq: Once | ORAL | Status: AC
  Administered 2017-04-04: 30 mL via ORAL
  Filled 2017-04-04: qty 30

## 2017-04-04 NOTE — ED Triage Notes (Signed)
Pt complaint of chest pain onset 40 minutes ago; hx of GERD "but this is different." Pt verbalizes jaw heaviness, left shoulder/arm twisting like a knife, and left chest sharpness.

## 2017-04-04 NOTE — Discharge Instructions (Signed)
Your work up is reassuring today. No signs of blood clot or heart attack. Please make sure you follow up with your primary care doctor or return to the ED if your symptoms worsen.
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
## 2017-04-08 ENCOUNTER — Encounter (HOSPITAL_COMMUNITY): Payer: Self-pay | Admitting: Emergency Medicine

## 2017-04-08 ENCOUNTER — Emergency Department (HOSPITAL_COMMUNITY)
Admission: EM | Admit: 2017-04-08 | Discharge: 2017-04-08 | Disposition: A | Discharge status: Home / Self Care | Payer: Commercial Managed Care - PPO | Attending: Dermatology | Admitting: Dermatology

## 2017-04-08 ENCOUNTER — Emergency Department (HOSPITAL_COMMUNITY): Payer: Commercial Managed Care - PPO

## 2017-04-08 DIAGNOSIS — R079 Chest pain, unspecified: Secondary | ICD-10-CM | POA: Diagnosis present

## 2017-04-08 HISTORY — DX: Fibromyalgia: M79.7

## 2017-04-08 LAB — I-STAT TROPONIN, ED: Troponin i, poc: 0 ng/mL (ref 0.00–0.08)

## 2017-04-08 LAB — BASIC METABOLIC PANEL
ANION GAP: 6 (ref 5–15)
BUN: 11 mg/dL (ref 6–20)
CHLORIDE: 106 mmol/L (ref 101–111)
CO2: 27 mmol/L (ref 22–32)
Calcium: 8.9 mg/dL (ref 8.9–10.3)
Creatinine, Ser: 0.94 mg/dL (ref 0.44–1.00)
GFR calc Af Amer: >60 mL/min (ref >60)
GFR calc non Af Amer: >60 mL/min (ref >60)
GLUCOSE: 120 mg/dL — AB (ref 65–99)
Potassium: 4.1 mmol/L (ref 3.5–5.1)
Sodium: 139 mmol/L (ref 135–145)

## 2017-04-08 LAB — CBC
HEMATOCRIT: 42.7 % (ref 36.0–46.0)
HEMOGLOBIN: 13.7 g/dL (ref 12.0–15.0)
MCH: 27.9 pg (ref 26.0–34.0)
MCHC: 32.1 g/dL (ref 30.0–36.0)
MCV: 87 fL (ref 78.0–100.0)
Platelets: 301 10*3/uL (ref 150–400)
RBC: 4.91 MIL/uL (ref 3.87–5.11)
RDW: 13.4 % (ref 11.5–15.5)
WBC: 11 10*3/uL — ABNORMAL HIGH (ref 4.0–10.5)

## 2017-04-08 NOTE — ED Triage Notes (Signed)
Patient reports mid chest pain radiating to left jaw and left shoulder onset today , mild SOB , no nausea or diaphoresis .

## 2017-05-18 ENCOUNTER — Other Ambulatory Visit: Payer: Self-pay | Admitting: Family Medicine

## 2017-05-18 ENCOUNTER — Other Ambulatory Visit: Payer: Self-pay | Admitting: Nurse Practitioner

## 2017-05-18 DIAGNOSIS — N63 Unspecified lump in unspecified breast: Secondary | ICD-10-CM

## 2017-05-18 DIAGNOSIS — Z1231 Encounter for screening mammogram for malignant neoplasm of breast: Secondary | ICD-10-CM

## 2017-05-21 ENCOUNTER — Ambulatory Visit
Admission: RE | Admit: 2017-05-21 | Discharge: 2017-05-21 | Disposition: A | Discharge status: Home / Self Care | Payer: Commercial Managed Care - PPO | Source: Ambulatory Visit | Attending: Nurse Practitioner | Admitting: Nurse Practitioner

## 2017-05-21 ENCOUNTER — Other Ambulatory Visit: Payer: Managed Care, Other (non HMO)

## 2017-05-21 DIAGNOSIS — N63 Unspecified lump in unspecified breast: Secondary | ICD-10-CM

## 2017-12-08 IMAGING — CR DG CHEST 2V
2 series · 2 of 2 positions shown · non-contrast
Comparison: Acute abdomen series of 07/17/2015

CLINICAL DATA: Cough and fever.

EXAM:
CHEST  2 VIEW

[lateral]
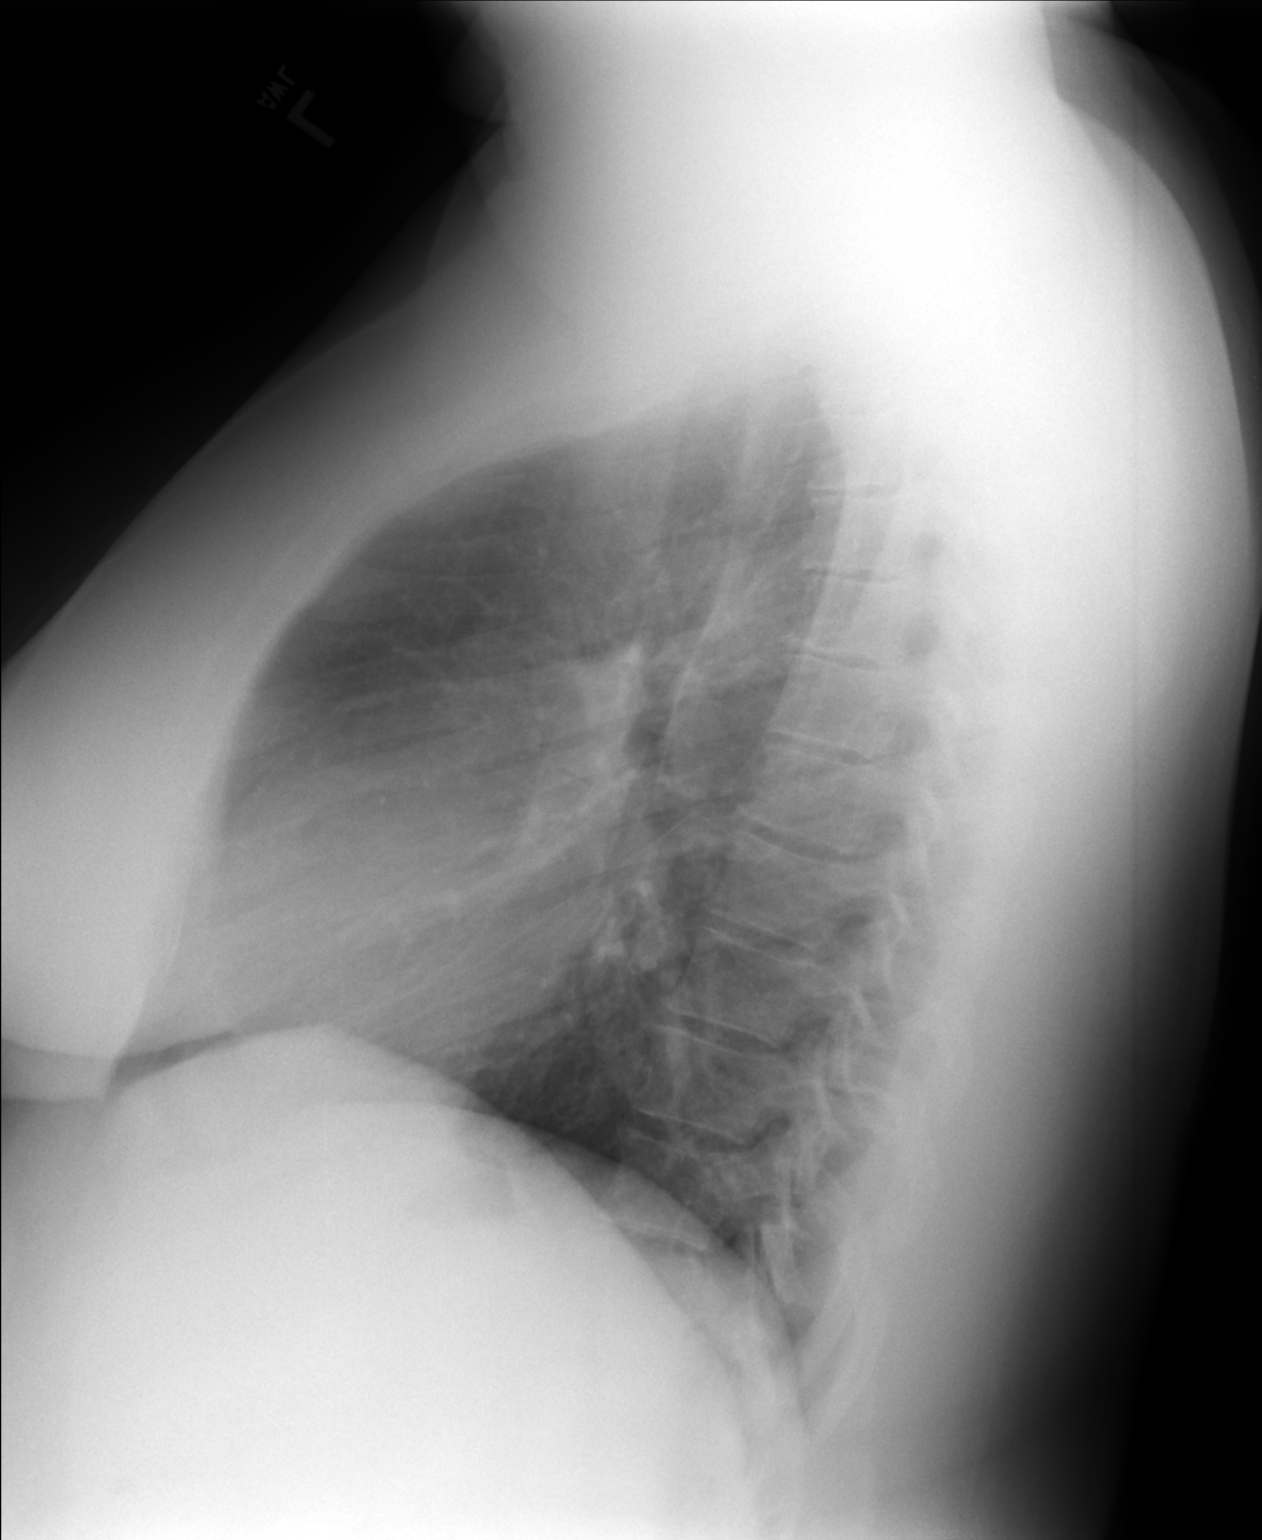

[PA]
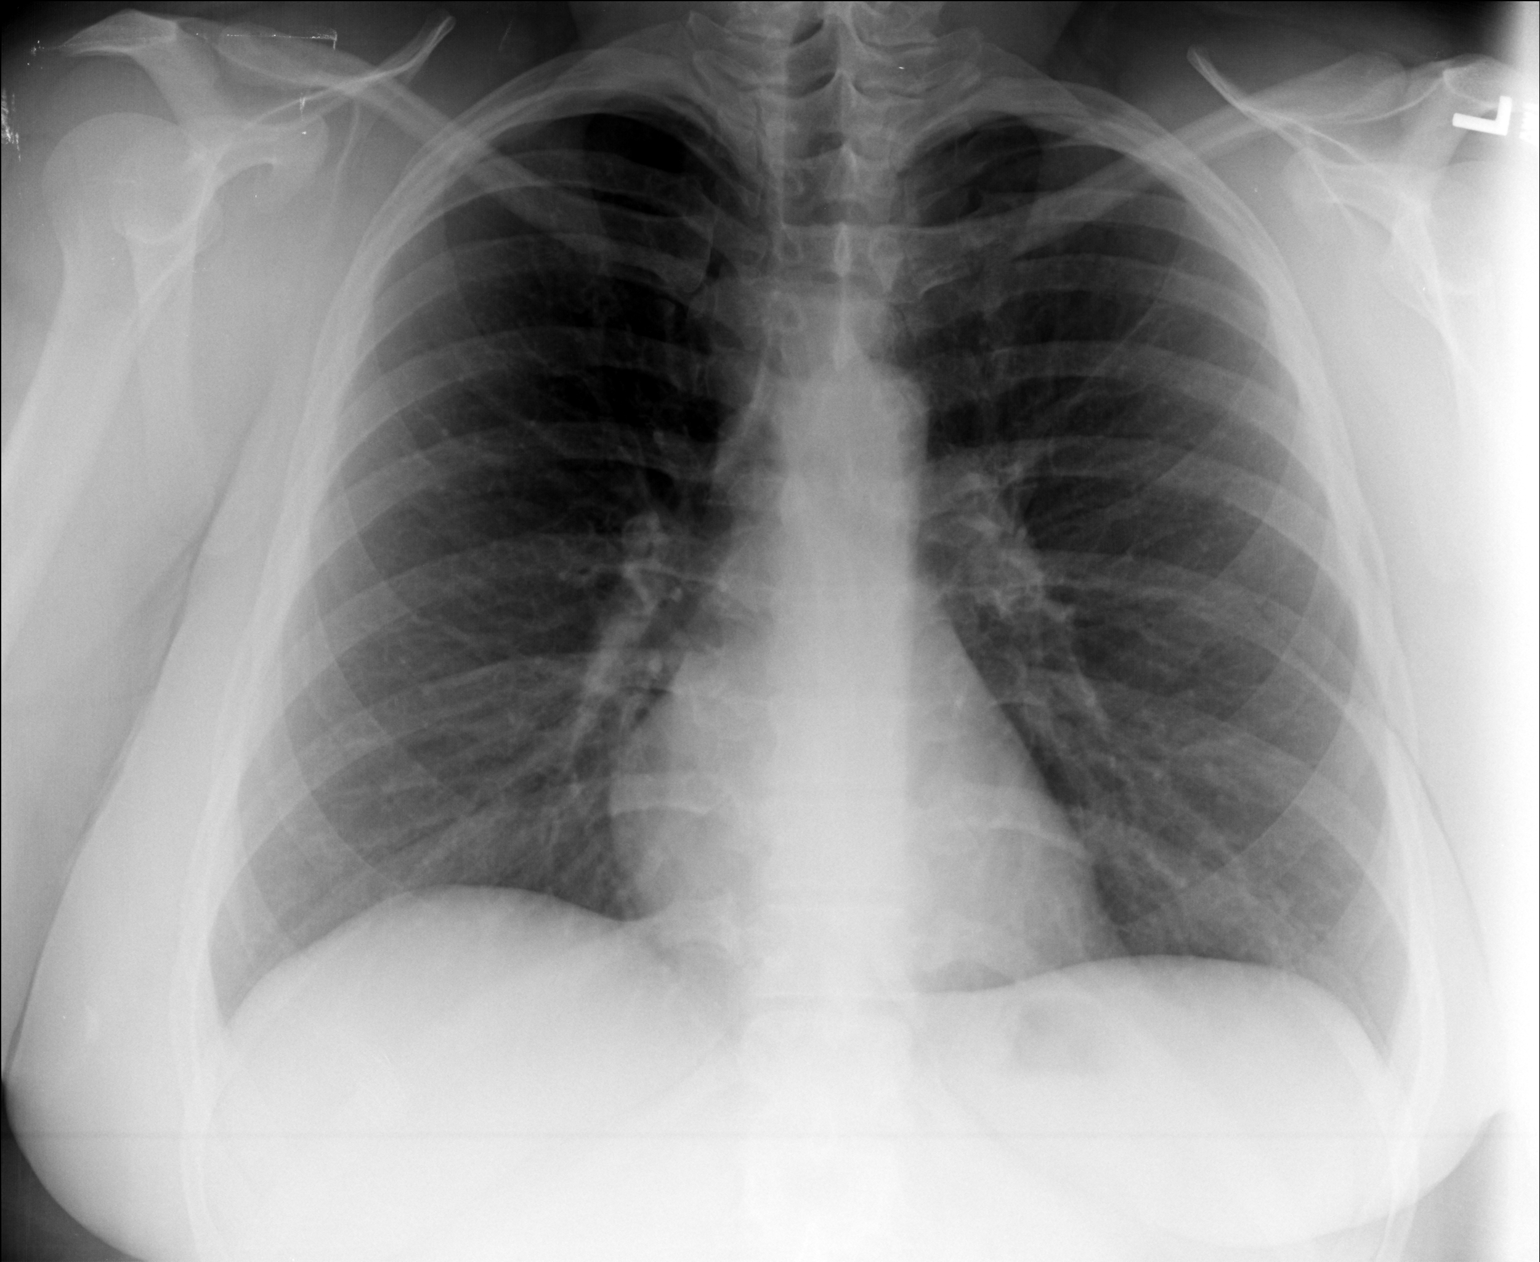

[2 of 2 positions shown; findings below may reference images not displayed]

FINDINGS: Midline trachea.  Normal heart size and mediastinal contours.

Sharp costophrenic angles.  No pneumothorax.  Clear lungs.
IMPRESSION: No active cardiopulmonary disease.

## 2018-08-04 ENCOUNTER — Emergency Department (HOSPITAL_COMMUNITY)
Admission: EM | Admit: 2018-08-04 | Discharge: 2018-08-04 | Disposition: A | Discharge status: Home / Self Care | Payer: Commercial Managed Care - PPO | Attending: Emergency Medicine | Admitting: Emergency Medicine

## 2018-08-04 ENCOUNTER — Emergency Department (HOSPITAL_COMMUNITY): Payer: Commercial Managed Care - PPO

## 2018-08-04 ENCOUNTER — Encounter (HOSPITAL_COMMUNITY): Payer: Self-pay

## 2018-08-04 ENCOUNTER — Other Ambulatory Visit: Payer: Self-pay

## 2018-08-04 DIAGNOSIS — R059 Cough, unspecified: Secondary | ICD-10-CM

## 2018-08-04 DIAGNOSIS — Z79899 Other long term (current) drug therapy: Secondary | ICD-10-CM | POA: Diagnosis not present

## 2018-08-04 DIAGNOSIS — F419 Anxiety disorder, unspecified: Secondary | ICD-10-CM | POA: Insufficient documentation

## 2018-08-04 DIAGNOSIS — J4541 Moderate persistent asthma with (acute) exacerbation: Secondary | ICD-10-CM | POA: Insufficient documentation

## 2018-08-04 DIAGNOSIS — F329 Major depressive disorder, single episode, unspecified: Secondary | ICD-10-CM | POA: Insufficient documentation

## 2018-08-04 DIAGNOSIS — R05 Cough: Secondary | ICD-10-CM | POA: Insufficient documentation

## 2018-08-04 DIAGNOSIS — R0602 Shortness of breath: Secondary | ICD-10-CM | POA: Diagnosis present

## 2018-08-04 LAB — BASIC METABOLIC PANEL
Anion gap: 10 (ref 5–15)
BUN: 11 mg/dL (ref 6–20)
CHLORIDE: 110 mmol/L (ref 98–111)
CO2: 23 mmol/L (ref 22–32)
Calcium: 9.3 mg/dL (ref 8.9–10.3)
Creatinine, Ser: 0.97 mg/dL (ref 0.44–1.00)
GFR calc Af Amer: >60 mL/min (ref >60)
GFR calc non Af Amer: >60 mL/min (ref >60)
Glucose, Bld: 256 mg/dL — ABNORMAL HIGH (ref 70–99)
POTASSIUM: 3.9 mmol/L (ref 3.5–5.1)
SODIUM: 143 mmol/L (ref 135–145)

## 2018-08-04 LAB — CBC WITH DIFFERENTIAL/PLATELET
Basophils Absolute: 0 10*3/uL (ref 0.0–0.1)
Basophils Relative: 0 %
EOS PCT: 0 %
Eosinophils Absolute: 0 10*3/uL (ref 0.0–0.7)
HCT: 42.5 % (ref 36.0–46.0)
HEMOGLOBIN: 13.9 g/dL (ref 12.0–15.0)
LYMPHS ABS: 0.9 10*3/uL (ref 0.7–4.0)
LYMPHS PCT: 11 %
MCH: 28.4 pg (ref 26.0–34.0)
MCHC: 32.7 g/dL (ref 30.0–36.0)
MCV: 86.7 fL (ref 78.0–100.0)
Monocytes Absolute: 0.1 10*3/uL (ref 0.1–1.0)
Monocytes Relative: 1 %
NEUTROS PCT: 88 %
Neutro Abs: 7.1 10*3/uL (ref 1.7–7.7)
Platelets: 344 10*3/uL (ref 150–400)
RBC: 4.9 MIL/uL (ref 3.87–5.11)
RDW: 12.8 % (ref 11.5–15.5)
WBC: 8 10*3/uL (ref 4.0–10.5)

## 2018-08-04 LAB — D-DIMER, QUANTITATIVE: D-Dimer, Quant: 0.31 ug/mL-FEU (ref 0.00–0.50)

## 2018-08-04 LAB — I-STAT BETA HCG BLOOD, ED (MC, WL, AP ONLY): I-stat hCG, quantitative: <5 m[IU]/mL (ref <5)

## 2018-08-04 MED ORDER — IPRATROPIUM-ALBUTEROL 0.5-2.5 (3) MG/3ML IN SOLN: 3.0000 mL | Freq: Once | RESPIRATORY_TRACT | Status: DC

## 2018-08-04 MED ORDER — HYDROCODONE-HOMATROPINE 5-1.5 MG/5ML PO SYRP: 5.0000 mL | ORAL_SOLUTION | Freq: Four times a day (QID) | ORAL | 0 refills | Status: DC | PRN

## 2018-08-04 MED ORDER — ALBUTEROL (5 MG/ML) CONTINUOUS INHALATION SOLN
10.0000 mg/h | INHALATION_SOLUTION | Freq: Once | RESPIRATORY_TRACT | Status: AC
  Administered 2018-08-04: 10 mg/h via RESPIRATORY_TRACT
  Filled 2018-08-04: qty 20

## 2018-08-04 MED ORDER — IPRATROPIUM BROMIDE 0.02 % IN SOLN
0.5000 mg | Freq: Once | RESPIRATORY_TRACT | Status: AC
  Administered 2018-08-04: 0.5 mg via RESPIRATORY_TRACT
  Filled 2018-08-04: qty 2.5

## 2018-08-04 MED ORDER — ALBUTEROL SULFATE (2.5 MG/3ML) 0.083% IN NEBU
5.0000 mg | INHALATION_SOLUTION | Freq: Once | RESPIRATORY_TRACT | Status: DC
  Filled 2018-08-04: qty 6

## 2018-08-04 MED ORDER — PREDNISONE 20 MG PO TABS
60.0000 mg | ORAL_TABLET | Freq: Once | ORAL | Status: AC
  Administered 2018-08-04: 60 mg via ORAL
  Filled 2018-08-04: qty 3

## 2018-08-04 NOTE — ED Provider Notes (Signed)
  Face-to-face evaluation   History: She presents for evaluation of intermittent shortness of breath characterized by "attacks," which started today.  She is using her usual medications, without relief.  She saw a doctor at an urgent care earlier today and was treated with IM steroids, without significant improvement.  She has a sense of "near death or anaphylaxis."  She has had multiple nebulizers today and doses on her inhaler without improvement.  She complains of generalized achiness.  Physical exam: Awake, alert and cooperative.  No respiratory distress.  No oral swelling or airway compromise.  No stridor.  Lungs with good air movement bilaterally without wheezes rales or rhonchi.  Medical screening examination/treatment/procedure(s) were conducted as a shared visit with non-physician practitioner(s) and myself.  I personally evaluated the patient during the encounter    Mancel Bale, MD 08/05/18 2320

## 2018-08-04 NOTE — ED Triage Notes (Addendum)
Pt states she has had "7 asthma attacks in 24 hours". Pt states she was at urgent care earlier for the same. Pt states that she was directed to come over here by the urgent care. Pt states that she received a neb treatment , chest xray at urgent care earlier. Pt states she felt her upper airway was closing, but those symptoms have resolved.

## 2018-08-04 NOTE — ED Notes (Signed)
Pt requests that if for any reason, the MD,PA, or RN need to talk to her about her breast implants to please ask her mother to step out the room, as her mother is unaware of her implants and the pt wishes for it to stay that way

## 2018-08-04 NOTE — ED Notes (Signed)
Pt states she was recently given amoxicillin. Pt concerned her SHOB could be related to taking the med.
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
## 2018-08-04 NOTE — Discharge Instructions (Addendum)
You were seen in the ER for persistent cough, shortness of breath.  Work-up today was reassuring.  There is no pneumonia on your chest x-ray.  Blood test for a blood clot was negative.  Symptoms are most likely from asthma exacerbation.  We will treat this with prednisone as prescribed to you by previous nurse practitioner.  Continue your breathing treatments at home.  Continue taking Singulair.  Take Hycodan syrup every 6 hours for cough.  I sent urine to be tested for infection.  I will follow up on these results, and notify you via phone only if there urine is abnormal and there is an infection.    Return to the ER for worsening symptoms, fever greater than 100.57F, chest pain or shortness of breath with exertion or activity  Follow up with your nurse practitioner as scheduled

## 2018-11-26 ENCOUNTER — Ambulatory Visit (INDEPENDENT_AMBULATORY_CARE_PROVIDER_SITE_OTHER): Payer: Commercial Managed Care - PPO | Admitting: Family Medicine

## 2018-12-03 ENCOUNTER — Ambulatory Visit (INDEPENDENT_AMBULATORY_CARE_PROVIDER_SITE_OTHER): Payer: Self-pay

## 2018-12-03 ENCOUNTER — Encounter (INDEPENDENT_AMBULATORY_CARE_PROVIDER_SITE_OTHER): Payer: Self-pay | Admitting: Family Medicine

## 2018-12-03 ENCOUNTER — Ambulatory Visit (INDEPENDENT_AMBULATORY_CARE_PROVIDER_SITE_OTHER): Payer: Commercial Managed Care - PPO | Admitting: Family Medicine

## 2018-12-03 DIAGNOSIS — M546 Pain in thoracic spine: Secondary | ICD-10-CM

## 2018-12-03 DIAGNOSIS — M25511 Pain in right shoulder: Secondary | ICD-10-CM

## 2018-12-03 DIAGNOSIS — G8929 Other chronic pain: Secondary | ICD-10-CM

## 2018-12-03 MED ORDER — BACLOFEN 10 MG PO TABS: 5.0000 mg | ORAL_TABLET | Freq: Every evening | ORAL | 1 refills | Status: DC | PRN
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
## 2018-12-28 ENCOUNTER — Other Ambulatory Visit (INDEPENDENT_AMBULATORY_CARE_PROVIDER_SITE_OTHER): Payer: Self-pay | Admitting: Family Medicine

## 2019-01-04 ENCOUNTER — Telehealth (INDEPENDENT_AMBULATORY_CARE_PROVIDER_SITE_OTHER): Payer: Self-pay | Admitting: Family Medicine

## 2019-01-04 ENCOUNTER — Telehealth (INDEPENDENT_AMBULATORY_CARE_PROVIDER_SITE_OTHER): Payer: Self-pay | Admitting: Physician Assistant

## 2019-01-04 DIAGNOSIS — M546 Pain in thoracic spine: Secondary | ICD-10-CM

## 2019-01-04 DIAGNOSIS — G8929 Other chronic pain: Secondary | ICD-10-CM

## 2019-01-04 DIAGNOSIS — M25511 Pain in right shoulder: Secondary | ICD-10-CM

## 2019-01-04 NOTE — Telephone Encounter (Signed)
Please advise 

## 2019-01-04 NOTE — Telephone Encounter (Signed)
error 

## 2019-01-04 NOTE — Telephone Encounter (Signed)
Orders placed for PT at [facility] location.  Could do a different one if not convenient.

## 2019-01-04 NOTE — Telephone Encounter (Signed)
Pt called saying she chiropractor isn't going to treat her unless she pays out of pocket for the services he wants to provide.   Pt is wondering if she can go to physical therepy instead ?  Pt# [phone]

## 2019-01-05 ENCOUNTER — Other Ambulatory Visit: Payer: Self-pay

## 2019-01-05 ENCOUNTER — Ambulatory Visit: Payer: Commercial Managed Care - PPO | Attending: Family Medicine | Admitting: Physical Therapy

## 2019-01-05 DIAGNOSIS — M62830 Muscle spasm of back: Secondary | ICD-10-CM | POA: Diagnosis present

## 2019-01-05 DIAGNOSIS — M5414 Radiculopathy, thoracic region: Secondary | ICD-10-CM

## 2019-01-05 DIAGNOSIS — R293 Abnormal posture: Secondary | ICD-10-CM | POA: Diagnosis present

## 2019-01-05 NOTE — Patient Instructions (Signed)
Access Code: [email or web address]  URL: [email or web address]  Date: 01/05/2019  Prepared by: Dorie Rank   Exercises  Supine Thoracic Mobilization Towel Roll Vertical with Arm Stretch - 5 reps - 1 sets - 30 sec hold - 1x daily - 7x weekly  Patient Education  Office Posture
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
## 2019-01-05 NOTE — Telephone Encounter (Signed)
Noted. Per chart, patient has appt today.

## 2019-01-06 ENCOUNTER — Telehealth (INDEPENDENT_AMBULATORY_CARE_PROVIDER_SITE_OTHER): Payer: Self-pay | Admitting: Family Medicine

## 2019-01-06 ENCOUNTER — Encounter (INDEPENDENT_AMBULATORY_CARE_PROVIDER_SITE_OTHER): Payer: Self-pay | Admitting: Family Medicine

## 2019-01-06 NOTE — Telephone Encounter (Signed)
Patient called stating her (PT) told her that she should not do any exercises and jumping around at this time. Patient said (PT) said she should not be dieting just concentrating on getting well. Patient asked if Dr Prince Rome will write a letter excusing her from the boot camp she joined. Patient said she will pick up note. The number to contact patient is [phone]

## 2019-01-06 NOTE — Telephone Encounter (Signed)
Letter printed.

## 2019-01-06 NOTE — Telephone Encounter (Signed)
Advised patient letter is ready for pick up

## 2019-01-06 NOTE — Telephone Encounter (Signed)
Please advise 

## 2019-01-10 ENCOUNTER — Ambulatory Visit: Payer: Commercial Managed Care - PPO | Admitting: Physical Therapy

## 2019-01-10 DIAGNOSIS — M5414 Radiculopathy, thoracic region: Secondary | ICD-10-CM | POA: Diagnosis not present

## 2019-01-10 DIAGNOSIS — R293 Abnormal posture: Secondary | ICD-10-CM

## 2019-01-10 DIAGNOSIS — M62830 Muscle spasm of back: Secondary | ICD-10-CM
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
## 2019-01-12 ENCOUNTER — Encounter: Payer: Self-pay | Admitting: Physical Therapy

## 2019-01-12 ENCOUNTER — Ambulatory Visit: Payer: Commercial Managed Care - PPO | Admitting: Physical Therapy

## 2019-01-12 DIAGNOSIS — M5414 Radiculopathy, thoracic region: Secondary | ICD-10-CM | POA: Diagnosis not present

## 2019-01-12 DIAGNOSIS — R293 Abnormal posture: Secondary | ICD-10-CM

## 2019-01-12 DIAGNOSIS — M62830 Muscle spasm of back: Secondary | ICD-10-CM
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
## 2019-01-17 ENCOUNTER — Ambulatory Visit: Payer: Commercial Managed Care - PPO | Admitting: Physical Therapy

## 2019-01-17 DIAGNOSIS — R293 Abnormal posture: Secondary | ICD-10-CM

## 2019-01-17 DIAGNOSIS — M5414 Radiculopathy, thoracic region: Secondary | ICD-10-CM | POA: Diagnosis not present

## 2019-01-17 DIAGNOSIS — M62830 Muscle spasm of back: Secondary | ICD-10-CM
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
## 2019-01-19 ENCOUNTER — Encounter: Payer: Self-pay | Admitting: Physical Therapy

## 2019-01-19 ENCOUNTER — Ambulatory Visit: Payer: Commercial Managed Care - PPO | Admitting: Physical Therapy

## 2019-01-19 DIAGNOSIS — R293 Abnormal posture: Secondary | ICD-10-CM

## 2019-01-19 DIAGNOSIS — M62830 Muscle spasm of back: Secondary | ICD-10-CM

## 2019-01-19 DIAGNOSIS — M5414 Radiculopathy, thoracic region: Secondary | ICD-10-CM | POA: Diagnosis not present
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
## 2019-01-24 ENCOUNTER — Ambulatory Visit: Payer: Commercial Managed Care - PPO | Attending: Family Medicine | Admitting: Physical Therapy

## 2019-01-24 DIAGNOSIS — M5414 Radiculopathy, thoracic region: Secondary | ICD-10-CM

## 2019-01-24 DIAGNOSIS — R293 Abnormal posture: Secondary | ICD-10-CM

## 2019-01-24 DIAGNOSIS — M62830 Muscle spasm of back: Secondary | ICD-10-CM
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
## 2019-01-26 ENCOUNTER — Encounter: Payer: Self-pay | Admitting: Physical Therapy

## 2019-01-26 ENCOUNTER — Ambulatory Visit: Payer: Commercial Managed Care - PPO | Admitting: Physical Therapy

## 2019-01-26 DIAGNOSIS — R293 Abnormal posture: Secondary | ICD-10-CM

## 2019-01-26 DIAGNOSIS — M5414 Radiculopathy, thoracic region: Secondary | ICD-10-CM

## 2019-01-26 DIAGNOSIS — M62830 Muscle spasm of back: Secondary | ICD-10-CM
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
## 2019-01-31 ENCOUNTER — Ambulatory Visit: Payer: Commercial Managed Care - PPO | Admitting: Physical Therapy

## 2019-02-02 ENCOUNTER — Ambulatory Visit: Payer: Commercial Managed Care - PPO | Admitting: Physical Therapy

## 2019-02-02 ENCOUNTER — Other Ambulatory Visit: Payer: Self-pay

## 2019-02-02 ENCOUNTER — Encounter: Payer: Self-pay | Admitting: Physical Therapy

## 2019-02-02 DIAGNOSIS — M62830 Muscle spasm of back: Secondary | ICD-10-CM

## 2019-02-02 DIAGNOSIS — M5414 Radiculopathy, thoracic region: Secondary | ICD-10-CM | POA: Diagnosis not present

## 2019-02-02 DIAGNOSIS — R293 Abnormal posture: Secondary | ICD-10-CM
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
## 2019-04-04 ENCOUNTER — Other Ambulatory Visit: Payer: Self-pay

## 2019-04-04 ENCOUNTER — Ambulatory Visit: Payer: Commercial Managed Care - PPO | Attending: Family Medicine

## 2019-04-04 DIAGNOSIS — M5414 Radiculopathy, thoracic region: Secondary | ICD-10-CM | POA: Insufficient documentation

## 2019-04-04 DIAGNOSIS — R293 Abnormal posture: Secondary | ICD-10-CM | POA: Diagnosis present

## 2019-04-04 DIAGNOSIS — M62830 Muscle spasm of back: Secondary | ICD-10-CM | POA: Diagnosis present
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
## 2019-04-04 NOTE — Patient Instructions (Signed)
Access Code: [email or web address]  URL: [email or web address]  Date: 04/04/2019  Prepared by: Lorrene Reid   Exercises    Supine Shoulder Horizontal Abduction with Resistance - 10 reps - 2 sets - 2x daily - 7x weekly  Supine Bilateral Shoulder External Rotation with Resistance - 10 reps - 2 sets - 2x daily - 7x weekly  Supine PNF D2 Flexion with Resistance - 10 reps - 2 sets - 2x daily - 7x weekly  Patient Education  Office Posture

## 2019-04-06 ENCOUNTER — Ambulatory Visit (INDEPENDENT_AMBULATORY_CARE_PROVIDER_SITE_OTHER): Payer: Commercial Managed Care - PPO | Admitting: Family Medicine

## 2019-04-06 ENCOUNTER — Encounter: Payer: Self-pay | Admitting: Family Medicine

## 2019-04-06 ENCOUNTER — Ambulatory Visit: Payer: Commercial Managed Care - PPO | Admitting: Physical Therapy

## 2019-04-06 ENCOUNTER — Encounter: Payer: Self-pay | Admitting: Physical Therapy

## 2019-04-06 ENCOUNTER — Other Ambulatory Visit: Payer: Self-pay | Admitting: Nurse Practitioner

## 2019-04-06 ENCOUNTER — Other Ambulatory Visit: Payer: Self-pay

## 2019-04-06 DIAGNOSIS — M5414 Radiculopathy, thoracic region: Secondary | ICD-10-CM | POA: Diagnosis not present

## 2019-04-06 DIAGNOSIS — Z1231 Encounter for screening mammogram for malignant neoplasm of breast: Secondary | ICD-10-CM

## 2019-04-06 DIAGNOSIS — M79671 Pain in right foot: Secondary | ICD-10-CM | POA: Diagnosis not present

## 2019-04-06 DIAGNOSIS — M62830 Muscle spasm of back: Secondary | ICD-10-CM

## 2019-04-06 DIAGNOSIS — R293 Abnormal posture: Secondary | ICD-10-CM
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
## 2019-04-12 ENCOUNTER — Encounter: Payer: Self-pay | Admitting: Physical Therapy

## 2019-04-12 ENCOUNTER — Ambulatory Visit: Payer: Commercial Managed Care - PPO | Admitting: Physical Therapy

## 2019-04-12 ENCOUNTER — Other Ambulatory Visit: Payer: Self-pay

## 2019-04-12 DIAGNOSIS — M62830 Muscle spasm of back: Secondary | ICD-10-CM

## 2019-04-12 DIAGNOSIS — R293 Abnormal posture: Secondary | ICD-10-CM

## 2019-04-12 DIAGNOSIS — M5414 Radiculopathy, thoracic region: Secondary | ICD-10-CM | POA: Diagnosis not present

## 2019-04-12 NOTE — Patient Instructions (Signed)
Access Code: [email or web address]  URL: [email or web address]  Date: 04/12/2019  Prepared by: [name]   Exercises  Sidelying Thoracic Rotation with Open Book - 5 reps - 1 sets - 10 sec hold - 1x daily - 7x weekly  Seated Thoracic Lumbar Extension - 10 reps - 1 sets - 5 sec hold - 1x daily - 7x weekly  Supine Shoulder Horizontal Abduction with Resistance - 10 reps - 2 sets - 2x daily - 7x weekly  Supine Bilateral Shoulder External Rotation with Resistance - 10 reps - 2 sets - 2x daily - 7x weekly  Supine PNF D2 Flexion with Resistance - 10 reps - 2 sets - 2x daily - 7x weekly  Thoracic Extension with Foam Roll - 10 reps - 1 sets - 2x daily - 7x weekly  Patient Education  Office Posture   [facility] [address], [address] [place], [place] [address] Phone # [phone] Fax [phone]
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
## 2019-04-14 ENCOUNTER — Ambulatory Visit: Payer: Commercial Managed Care - PPO | Admitting: Physical Therapy

## 2019-04-14 ENCOUNTER — Telehealth: Payer: Self-pay | Admitting: Physical Therapy

## 2019-04-14 ENCOUNTER — Other Ambulatory Visit: Payer: Self-pay

## 2019-04-14 ENCOUNTER — Encounter: Payer: Self-pay | Admitting: Physical Therapy

## 2019-04-14 DIAGNOSIS — M62830 Muscle spasm of back: Secondary | ICD-10-CM

## 2019-04-14 DIAGNOSIS — M5414 Radiculopathy, thoracic region: Secondary | ICD-10-CM

## 2019-04-14 DIAGNOSIS — R293 Abnormal posture: Secondary | ICD-10-CM

## 2019-04-14 NOTE — Patient Instructions (Signed)
Access Code: [email or web address]  URL: [email or web address]  Date: 04/14/2019  Prepared by: [name]   Exercises  Sidelying Thoracic Rotation with Open Book - 15 reps - 1 sets - 1x daily - 7x weekly  Thoracic Extension with Foam Roll - 10 reps - 1 sets - 2x daily - 7x weekly  Prone Scapular Retraction Arms at Side - 10 reps - 1x daily - 7x weekly  Prone W Scapular Retraction - 10 reps - 1x daily - 7x weekly  Standing shoulder flexion wall slides - 15-20 reps - 1x daily - 7x weekly  Patient Education  Office Posture   [facility] [address], [address] [place], [place] [address] Phone # [phone] Fax [phone]

## 2019-04-14 NOTE — Telephone Encounter (Signed)
Called pt to offer earlier appointment.   2:48 PM,04/14/19 [name] PT, DPT [facility] [place]  [phone]
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
## 2019-04-19 ENCOUNTER — Encounter: Payer: Commercial Managed Care - PPO | Admitting: Physical Therapy

## 2019-04-21 ENCOUNTER — Encounter: Payer: Commercial Managed Care - PPO | Admitting: Physical Therapy

## 2019-04-25 ENCOUNTER — Telehealth: Payer: Self-pay | Admitting: Physical Therapy

## 2019-04-25 ENCOUNTER — Ambulatory Visit: Payer: Commercial Managed Care - PPO | Attending: Family Medicine | Admitting: Physical Therapy

## 2019-04-25 DIAGNOSIS — M62830 Muscle spasm of back: Secondary | ICD-10-CM | POA: Insufficient documentation

## 2019-04-25 DIAGNOSIS — M5414 Radiculopathy, thoracic region: Secondary | ICD-10-CM | POA: Insufficient documentation

## 2019-04-25 DIAGNOSIS — R293 Abnormal posture: Secondary | ICD-10-CM | POA: Insufficient documentation

## 2019-04-25 NOTE — Telephone Encounter (Signed)
PTA called and LM on voice mail regarding missed PT appt.   Ane Payment, PTA @TODAY @ 4:18 PM

## 2019-04-27 ENCOUNTER — Ambulatory Visit: Payer: Commercial Managed Care - PPO | Admitting: Physical Therapy

## 2019-04-27 ENCOUNTER — Other Ambulatory Visit: Payer: Self-pay

## 2019-04-27 ENCOUNTER — Encounter: Payer: Self-pay | Admitting: Physical Therapy

## 2019-04-27 DIAGNOSIS — M5414 Radiculopathy, thoracic region: Secondary | ICD-10-CM

## 2019-04-27 DIAGNOSIS — M62830 Muscle spasm of back: Secondary | ICD-10-CM | POA: Diagnosis present

## 2019-04-27 DIAGNOSIS — R293 Abnormal posture: Secondary | ICD-10-CM | POA: Diagnosis present
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
## 2019-04-30 ENCOUNTER — Ambulatory Visit
Admission: RE | Admit: 2019-04-30 | Discharge: 2019-04-30 | Disposition: A | Discharge status: Home / Self Care | Payer: Commercial Managed Care - PPO | Source: Ambulatory Visit | Attending: Nurse Practitioner | Admitting: Nurse Practitioner

## 2019-04-30 ENCOUNTER — Other Ambulatory Visit: Payer: Self-pay

## 2019-04-30 DIAGNOSIS — Z1231 Encounter for screening mammogram for malignant neoplasm of breast: Secondary | ICD-10-CM

## 2020-02-09 ENCOUNTER — Ambulatory Visit: Payer: Commercial Managed Care - PPO

## 2020-02-24 ENCOUNTER — Ambulatory Visit: Payer: Commercial Managed Care - PPO | Attending: Internal Medicine

## 2020-02-24 DIAGNOSIS — Z23 Encounter for immunization: Secondary | ICD-10-CM

## 2020-02-24 NOTE — Progress Notes (Signed)
   Covid-19 Vaccination Clinic  Name:  GUIDA ASMAN    MRN: [id number] DOB: 1976-01-01  02/24/2020  Ms. Wann was observed post Covid-19 immunization for 15 minutes without incident. She was provided with Vaccine Information Sheet and instruction to access the V-Safe system.   Ms. Renken was instructed to call 911 with any severe reactions post vaccine: Marland Kitchen Difficulty breathing  . Swelling of face and throat  . A fast heartbeat  . A bad rash all over body  . Dizziness and weakness   Immunizations Administered    Name Date Dose VIS Date Route   Pfizer COVID-19 Vaccine 02/24/2020 12:13 PM 0.3 mL 11/04/2019 Intramuscular   Manufacturer: [employer], [employer]   Lot: [id number]   NDC: [address]-1

## 2020-03-21 ENCOUNTER — Ambulatory Visit: Payer: Commercial Managed Care - PPO | Attending: Internal Medicine

## 2020-03-21 DIAGNOSIS — Z23 Encounter for immunization: Secondary | ICD-10-CM

## 2020-03-21 NOTE — Progress Notes (Signed)
   Covid-19 Vaccination Clinic  Name:  Gina Schneider    MRN: [id number] DOB: 03/07/1976  03/21/2020  [name] was observed post Covid-19 immunization for 15 minutes without incident. She was provided with Vaccine Information Sheet and instruction to access the V-Safe system.   [name] was instructed to call 911 with any severe reactions post vaccine: Marland Kitchen Difficulty breathing  . Swelling of face and throat  . A fast heartbeat  . A bad rash all over body  . Dizziness and weakness   Immunizations Administered    Name Date Dose VIS Date Route   Pfizer COVID-19 Vaccine 03/21/2020  8:38 AM 0.3 mL 01/18/2019 Intramuscular   Manufacturer: [employer], [employer]   Lot: [id number]   NDC: [address]-4

## 2020-07-11 ENCOUNTER — Telehealth: Payer: Self-pay
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
## 2020-10-17 ENCOUNTER — Other Ambulatory Visit: Payer: Self-pay | Admitting: Endocrinology

## 2020-10-17 DIAGNOSIS — Z1231 Encounter for screening mammogram for malignant neoplasm of breast: Secondary | ICD-10-CM

## 2020-12-04 ENCOUNTER — Ambulatory Visit: Payer: Commercial Managed Care - PPO

## 2021-02-14 ENCOUNTER — Other Ambulatory Visit: Payer: Self-pay

## 2021-02-14 ENCOUNTER — Ambulatory Visit
Admission: RE | Admit: 2021-02-14 | Discharge: 2021-02-14 | Disposition: A | Discharge status: Home / Self Care | Payer: Commercial Managed Care - PPO | Source: Ambulatory Visit | Attending: Endocrinology | Admitting: Endocrinology

## 2021-02-14 DIAGNOSIS — Z1231 Encounter for screening mammogram for malignant neoplasm of breast: Secondary | ICD-10-CM

## 2021-10-09 ENCOUNTER — Emergency Department (HOSPITAL_BASED_OUTPATIENT_CLINIC_OR_DEPARTMENT_OTHER): Payer: Commercial Managed Care - PPO

## 2021-10-09 ENCOUNTER — Encounter: Payer: Self-pay | Admitting: Orthopedic Surgery

## 2021-10-09 ENCOUNTER — Encounter (HOSPITAL_BASED_OUTPATIENT_CLINIC_OR_DEPARTMENT_OTHER): Payer: Self-pay

## 2021-10-09 ENCOUNTER — Other Ambulatory Visit: Payer: Self-pay

## 2021-10-09 ENCOUNTER — Emergency Department (HOSPITAL_BASED_OUTPATIENT_CLINIC_OR_DEPARTMENT_OTHER)
Admission: EM | Admit: 2021-10-09 | Discharge: 2021-10-09 | Disposition: A | Discharge status: Home / Self Care | Payer: Commercial Managed Care - PPO | Attending: Emergency Medicine | Admitting: Emergency Medicine

## 2021-10-09 ENCOUNTER — Ambulatory Visit: Payer: Commercial Managed Care - PPO | Admitting: Orthopedic Surgery

## 2021-10-09 DIAGNOSIS — J45909 Unspecified asthma, uncomplicated: Secondary | ICD-10-CM | POA: Diagnosis not present

## 2021-10-09 DIAGNOSIS — M94261 Chondromalacia, right knee: Secondary | ICD-10-CM | POA: Diagnosis not present

## 2021-10-09 DIAGNOSIS — M25462 Effusion, left knee: Secondary | ICD-10-CM | POA: Diagnosis not present

## 2021-10-09 DIAGNOSIS — M25562 Pain in left knee: Secondary | ICD-10-CM | POA: Diagnosis not present

## 2021-10-09 DIAGNOSIS — M94262 Chondromalacia, left knee: Secondary | ICD-10-CM | POA: Diagnosis not present

## 2021-10-09 MED ORDER — KETOROLAC TROMETHAMINE 60 MG/2ML IM SOLN
30.0000 mg | Freq: Once | INTRAMUSCULAR | Status: AC
  Administered 2021-10-09: 30 mg via INTRAMUSCULAR

## 2021-10-09 MED ORDER — KETOROLAC TROMETHAMINE 30 MG/ML IJ SOLN
30.0000 mg | Freq: Once | INTRAMUSCULAR | Status: DC
  Filled 2021-10-09: qty 1

## 2021-10-09 NOTE — ED Notes (Signed)
XR bedside.

## 2021-10-09 NOTE — Discharge Instructions (Addendum)
Call Dr. Steward Drone, Orthopedist today to schedule an appointment in regards to todays visit. You may take over the counter 600 mg ibuprofen every 6 hours for no more than 7 days. You may apply ice to affected area for up to 15 minutes at a time. Return to the Emergency Department if you are experiencing increasing or worsening redness, swelling, unable to walk, fever.
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
## 2021-10-10 ENCOUNTER — Ambulatory Visit: Payer: Commercial Managed Care - PPO | Admitting: Orthopedic Surgery

## 2021-10-11 ENCOUNTER — Encounter: Payer: Self-pay | Admitting: Orthopedic Surgery

## 2021-10-11 MED ORDER — BUPIVACAINE HCL 0.25 % IJ SOLN
4.0000 mL | INTRAMUSCULAR | Status: AC | PRN
  Administered 2021-10-09: 4 mL via INTRA_ARTICULAR

## 2021-10-11 MED ORDER — METHYLPREDNISOLONE ACETATE 40 MG/ML IJ SUSP
40.0000 mg | INTRAMUSCULAR | Status: AC | PRN
Start: 2021-10-09 — End: 2021-10-09
  Administered 2021-10-09: 40 mg via INTRA_ARTICULAR

## 2021-10-11 MED ORDER — METHYLPREDNISOLONE ACETATE 40 MG/ML IJ SUSP
40.0000 mg | INTRAMUSCULAR | Status: AC | PRN
  Administered 2021-10-09: 40 mg via INTRA_ARTICULAR

## 2021-10-11 MED ORDER — LIDOCAINE HCL 1 % IJ SOLN
5.0000 mL | INTRAMUSCULAR | Status: AC | PRN
  Administered 2021-10-09: 5 mL
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
## 2021-10-16 ENCOUNTER — Telehealth: Payer: Self-pay | Admitting: Orthopedic Surgery

## 2021-10-16 NOTE — Telephone Encounter (Signed)
Called patient got recording that call can not be completed at this time and to please try call again later.   Emailed patient to contact me to schedule an MRI review with Dr. August Saucer.

## 2021-10-29 ENCOUNTER — Ambulatory Visit
Admission: RE | Admit: 2021-10-29 | Discharge: 2021-10-29 | Disposition: A | Discharge status: Home / Self Care | Payer: Commercial Managed Care - PPO | Source: Ambulatory Visit | Attending: Orthopedic Surgery | Admitting: Orthopedic Surgery

## 2021-10-29 ENCOUNTER — Other Ambulatory Visit: Payer: Self-pay

## 2021-10-29 DIAGNOSIS — M25562 Pain in left knee: Secondary | ICD-10-CM

## 2022-02-04 ENCOUNTER — Other Ambulatory Visit: Payer: Self-pay | Admitting: Endocrinology

## 2022-02-04 DIAGNOSIS — Z1231 Encounter for screening mammogram for malignant neoplasm of breast: Secondary | ICD-10-CM

## 2022-02-18 ENCOUNTER — Ambulatory Visit
Admission: RE | Admit: 2022-02-18 | Discharge: 2022-02-18 | Disposition: A | Discharge status: Home / Self Care | Payer: Commercial Managed Care - PPO | Source: Ambulatory Visit | Attending: Endocrinology | Admitting: Endocrinology

## 2022-02-18 DIAGNOSIS — Z1231 Encounter for screening mammogram for malignant neoplasm of breast: Secondary | ICD-10-CM

## 2022-06-12 DIAGNOSIS — Z0289 Encounter for other administrative examinations: Secondary | ICD-10-CM

## 2022-06-16 ENCOUNTER — Ambulatory Visit (INDEPENDENT_AMBULATORY_CARE_PROVIDER_SITE_OTHER): Payer: Self-pay | Admitting: Family Medicine

## 2022-06-30 ENCOUNTER — Ambulatory Visit (INDEPENDENT_AMBULATORY_CARE_PROVIDER_SITE_OTHER): Payer: Commercial Managed Care - PPO | Admitting: Family Medicine

## 2022-06-30 DIAGNOSIS — Z0289 Encounter for other administrative examinations: Secondary | ICD-10-CM

## 2022-07-02 ENCOUNTER — Encounter (INDEPENDENT_AMBULATORY_CARE_PROVIDER_SITE_OTHER): Payer: Self-pay

## 2022-10-10 ENCOUNTER — Telehealth: Payer: Self-pay | Admitting: Physician Assistant

## 2022-10-10 ENCOUNTER — Ambulatory Visit (INDEPENDENT_AMBULATORY_CARE_PROVIDER_SITE_OTHER): Payer: Commercial Managed Care - PPO

## 2022-10-10 ENCOUNTER — Other Ambulatory Visit: Payer: Self-pay | Admitting: Physician Assistant

## 2022-10-10 ENCOUNTER — Ambulatory Visit: Payer: Commercial Managed Care - PPO | Admitting: Physician Assistant

## 2022-10-10 DIAGNOSIS — M25512 Pain in left shoulder: Secondary | ICD-10-CM

## 2022-10-10 DIAGNOSIS — G8929 Other chronic pain: Secondary | ICD-10-CM

## 2022-10-10 MED ORDER — METHYLPREDNISOLONE ACETATE 40 MG/ML IJ SUSP
40.0000 mg | INTRAMUSCULAR | Status: AC | PRN
  Administered 2022-10-10: 40 mg via INTRA_ARTICULAR

## 2022-10-10 MED ORDER — LIDOCAINE HCL 1 % IJ SOLN
2.0000 mL | INTRAMUSCULAR | Status: AC | PRN
  Administered 2022-10-10: 2 mL

## 2022-10-10 MED ORDER — METHOCARBAMOL 500 MG PO TABS: 500.0000 mg | ORAL_TABLET | Freq: Three times a day (TID) | ORAL | 0 refills | Status: DC | PRN

## 2022-10-10 MED ORDER — BUPIVACAINE HCL 0.25 % IJ SOLN
2.0000 mL | INTRAMUSCULAR | Status: AC | PRN
  Administered 2022-10-10: 2 mL via INTRA_ARTICULAR

## 2022-10-10 NOTE — Telephone Encounter (Signed)
Pt had an appt with PA persons today and was told muscle relaxer would be sent to her pharmacy. Please [facility] [address]. Pt phone number is [phone].
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
## 2022-12-30 ENCOUNTER — Ambulatory Visit (INDEPENDENT_AMBULATORY_CARE_PROVIDER_SITE_OTHER): Payer: Commercial Managed Care - PPO | Admitting: Physician Assistant

## 2022-12-30 ENCOUNTER — Encounter: Payer: Self-pay | Admitting: Physician Assistant

## 2022-12-30 ENCOUNTER — Ambulatory Visit (INDEPENDENT_AMBULATORY_CARE_PROVIDER_SITE_OTHER): Payer: Commercial Managed Care - PPO

## 2022-12-30 DIAGNOSIS — G8929 Other chronic pain: Secondary | ICD-10-CM | POA: Diagnosis not present

## 2022-12-30 DIAGNOSIS — M25511 Pain in right shoulder: Secondary | ICD-10-CM

## 2022-12-30 DIAGNOSIS — M542 Cervicalgia: Secondary | ICD-10-CM

## 2022-12-30 MED ORDER — METHYLPREDNISOLONE 4 MG PO TBPK: ORAL_TABLET | ORAL | 0 refills | Status: AC

## 2022-12-30 MED ORDER — METHOCARBAMOL 500 MG PO TABS: 500.0000 mg | ORAL_TABLET | Freq: Three times a day (TID) | ORAL | 0 refills | Status: AC | PRN
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
## 2023-01-01 LAB — CBC WITH DIFFERENTIAL/PLATELET
Absolute Monocytes: 616 cells/uL (ref 200–950)
Basophils Absolute: 39 cells/uL (ref 0–200)
Basophils Relative: 0.5 %
Eosinophils Absolute: 108 cells/uL (ref 15–500)
Eosinophils Relative: 1.4 %
HCT: 43.8 % (ref 35.0–45.0)
Hemoglobin: 14.8 g/dL (ref 11.7–15.5)
Lymphs Abs: 2195 cells/uL (ref 850–3900)
MCH: 29.7 pg (ref 27.0–33.0)
MCHC: 33.8 g/dL (ref 32.0–36.0)
MCV: 87.8 fL (ref 80.0–100.0)
MPV: 11.4 fL (ref 7.5–12.5)
Monocytes Relative: 8 %
Neutro Abs: 4743 cells/uL (ref 1500–7800)
Neutrophils Relative %: 61.6 %
Platelets: 316 10*3/uL (ref 140–400)
RBC: 4.99 10*6/uL (ref 3.80–5.10)
RDW: 13.2 % (ref 11.0–15.0)
Total Lymphocyte: 28.5 %
WBC: 7.7 10*3/uL (ref 3.8–10.8)

## 2023-01-01 LAB — RHEUMATOID FACTOR: Rheumatoid fact SerPl-aCnc: <14 IU/mL (ref <14)

## 2023-01-01 LAB — C-REACTIVE PROTEIN: CRP: 0.5 mg/L (ref <8.0)

## 2023-01-01 LAB — ANA: Anti Nuclear Antibody (ANA): NEGATIVE

## 2023-01-01 LAB — SEDIMENTATION RATE: Sed Rate: 6 mm/h (ref 0–20)

## 2023-01-01 LAB — URIC ACID: Uric Acid, Serum: 4.7 mg/dL (ref 2.5–7.0)

## 2023-01-02 ENCOUNTER — Ambulatory Visit: Payer: Commercial Managed Care - PPO | Admitting: Surgical

## 2023-01-02 ENCOUNTER — Ambulatory Visit: Payer: Commercial Managed Care - PPO | Admitting: Physician Assistant

## 2023-01-08 ENCOUNTER — Other Ambulatory Visit: Payer: Self-pay | Admitting: Physician Assistant

## 2023-01-08 DIAGNOSIS — M542 Cervicalgia: Secondary | ICD-10-CM

## 2023-01-14 ENCOUNTER — Ambulatory Visit: Payer: Commercial Managed Care - PPO | Admitting: Orthopedic Surgery

## 2023-01-14 ENCOUNTER — Ambulatory Visit (INDEPENDENT_AMBULATORY_CARE_PROVIDER_SITE_OTHER): Payer: Commercial Managed Care - PPO

## 2023-01-14 ENCOUNTER — Encounter: Payer: Self-pay | Admitting: Orthopedic Surgery

## 2023-01-14 DIAGNOSIS — M5441 Lumbago with sciatica, right side: Secondary | ICD-10-CM

## 2023-01-14 DIAGNOSIS — M549 Dorsalgia, unspecified: Secondary | ICD-10-CM
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
## 2023-01-28 ENCOUNTER — Ambulatory Visit
Admission: RE | Admit: 2023-01-28 | Discharge: 2023-01-28 | Disposition: A | Discharge status: Home / Self Care | Payer: Commercial Managed Care - PPO | Source: Ambulatory Visit | Attending: Orthopedic Surgery | Admitting: Orthopedic Surgery

## 2023-01-28 ENCOUNTER — Ambulatory Visit
Admission: RE | Admit: 2023-01-28 | Discharge: 2023-01-28 | Disposition: A | Discharge status: Home / Self Care | Payer: Commercial Managed Care - PPO | Source: Ambulatory Visit | Attending: Physician Assistant | Admitting: Physician Assistant

## 2023-01-28 DIAGNOSIS — M542 Cervicalgia: Secondary | ICD-10-CM

## 2023-01-28 DIAGNOSIS — M549 Dorsalgia, unspecified: Secondary | ICD-10-CM

## 2023-01-29 NOTE — Progress Notes (Signed)
Discussed hi Gina Schneider.  I called Gina Schneider.  Can you cancel her appointment with [place] tomorrow.  I think she would like to talk to our surgeon mike more about intervention.  She says she is available tomorrow if he is but I do not know if that is too short of notice.  Thanks.

## 2023-01-30 ENCOUNTER — Ambulatory Visit: Payer: Commercial Managed Care - PPO | Admitting: Surgical

## 2023-02-02 ENCOUNTER — Ambulatory Visit
Payer: Commercial Managed Care - PPO | Attending: Physician Assistant | Admitting: Rehabilitative and Restorative Service Providers"

## 2023-02-02 ENCOUNTER — Encounter: Payer: Self-pay | Admitting: Rehabilitative and Restorative Service Providers"

## 2023-02-02 ENCOUNTER — Other Ambulatory Visit: Payer: Self-pay

## 2023-02-02 DIAGNOSIS — M542 Cervicalgia: Secondary | ICD-10-CM

## 2023-02-02 DIAGNOSIS — M546 Pain in thoracic spine: Secondary | ICD-10-CM

## 2023-02-02 DIAGNOSIS — G8929 Other chronic pain: Secondary | ICD-10-CM | POA: Insufficient documentation

## 2023-02-02 DIAGNOSIS — M25511 Pain in right shoulder: Secondary | ICD-10-CM | POA: Diagnosis not present

## 2023-02-02 DIAGNOSIS — R252 Cramp and spasm: Secondary | ICD-10-CM | POA: Diagnosis not present

## 2023-02-02 DIAGNOSIS — R293 Abnormal posture: Secondary | ICD-10-CM | POA: Diagnosis not present
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
## 2023-02-02 NOTE — Patient Instructions (Signed)

## 2023-02-04 ENCOUNTER — Other Ambulatory Visit: Payer: Commercial Managed Care - PPO

## 2023-02-04 ENCOUNTER — Ambulatory Visit: Payer: Commercial Managed Care - PPO | Admitting: Orthopedic Surgery

## 2023-02-05 ENCOUNTER — Encounter: Payer: Self-pay | Admitting: Orthopedic Surgery

## 2023-02-05 ENCOUNTER — Other Ambulatory Visit (INDEPENDENT_AMBULATORY_CARE_PROVIDER_SITE_OTHER): Payer: Commercial Managed Care - PPO

## 2023-02-05 ENCOUNTER — Ambulatory Visit (INDEPENDENT_AMBULATORY_CARE_PROVIDER_SITE_OTHER): Payer: Commercial Managed Care - PPO | Admitting: Orthopedic Surgery

## 2023-02-05 VITALS — BP 130/83 | HR 87 | Ht 67.0 in | Wt 180.4 lb

## 2023-02-05 DIAGNOSIS — M542 Cervicalgia: Secondary | ICD-10-CM

## 2023-02-05 DIAGNOSIS — M549 Dorsalgia, unspecified: Secondary | ICD-10-CM | POA: Diagnosis not present

## 2023-02-05 MED ORDER — GABAPENTIN 100 MG PO CAPS: 100.0000 mg | ORAL_CAPSULE | Freq: Three times a day (TID) | ORAL | 0 refills | Status: DC

## 2023-02-05 NOTE — Progress Notes (Signed)
Orthopedic Spine Surgery Office Note  Assessment: Patient is a 47 y.o. female with 2 issues:  1) neck pain that radiates into bilateral shoulders (R >L).  MRI shows left-sided foraminal stenosis at C5-6 and bilateral foraminal stenosis at C6/7. 2) thoracic back pain that radiates along the right anterior chest wall.  Has a T3/4 disc herniation on the right side  Plan: -Discussed her treatment options including nonoperative treatment.  Explained that she could try gabapentin, Tylenol, NSAIDs, physical therapy including extension exercises for her thoracic spine.  I also explained that there are operative treatments for both of these pathologies.  She is not interested in operative treatment at this time.  She wanted to try gabapentin and home exercise program at this time. Gabapentin was prescribed to her -Patient has tried chiropractor, massage, ice, heat, physical therapy, Tylenol, shoulder steroid injections -Could try cervical steroid injection if symptoms persist -Patient should return to office on an as-needed basis   Patient expressed understanding of the plan and all questions were answered to the patient's satisfaction.   ___________________________________________________________________________   History:  Patient is a 47 y.o. female who presents today for cervical spine.  Patient has had neck pain and decreased range of motion for several months now.  She notes pain radiating into bilateral shoulders.  Her right shoulder is more affected.  She now feels it radiating into her scapula on the right side.  She does not have pain radiating past the shoulder on either side.  Periodically gets numbness and tingling in the hands bilaterally.  Uses occurs at night.  It resolves if she changes her hand position or shakes it out.  No other numbness or paresthesias.  She has noticed a gets better if she flexes her neck.  This problem started after going to a chiropractor for manipulation.  She  has noticed it is getting slightly better now that she stopped going to chiropractor.  Her second issue is thoracic back pain that radiates along the anterior chest wall.  She feels this is a burning pain.  Pain is felt on a daily basis.  There is not a particular activity that makes it better or worse.  There is no numbness or paresthesias in the same distribution.  No similar symptoms on the contralateral side.  She does not recall any specific trauma or injury that brought this pain.  Weakness: Denies Difficulty with fine motor skills (e.g., buttoning shirts, handwriting): Denies Symptoms of imbalance: Denies Paresthesias and numbness: Periodically into her hands at night but resolves with shaking or moving her hands Bowel or bladder incontinence: Denies Saddle anesthesia: Denies  Treatments tried: chiropractor, massage, ice, heat, physical therapy, Tylenol, shoulder steroid injections  Review of systems: Denies fevers and chills, night sweats, unexplained weight loss, history of cancer.  Has had pain that wakes her at night  Past medical history: Fibromyalgia Depression GERD  Allergies: Amoxicillin  Past surgical history:  Breast augmentation Bilateral ankle surgery Cervical cerclage C-section D&C of uterus Sinus surgery  Social history: Denies use of nicotine product (smoking, vaping, patches, smokeless) Alcohol use: Reports 1-2 drinks per month Denies recreational drug use  Physical Exam:  General: no acute distress, appears stated age Neurologic: alert, answering questions appropriately, following commands Respiratory: unlabored breathing on room air, symmetric chest rise Psychiatric: appropriate affect, normal cadence to speech   MSK (spine):  -Strength exam      Left  Right Grip strength  5/5  5/5 Interosseus   5/5   5/5 Wrist extension  5/5  5/5 Wrist flexion   5/5  5/5 Elbow flexion   5/5  5/5 Deltoid    5/5  5/5  EHL    5/5  5/5 TA     5/5  5/5 GSC    5/5  5/5 Knee extension  5/5  5/5 Hip flexion   5/5  5/5  -Sensory exam    Sensation intact to light touch in L3-S1 nerve distributions of bilateral lower extremities  Sensation intact to light touch in C5-T1 nerve distributions of bilateral upper extremities  -Brachioradialis DTR: 2/4 on the left, 2/4 on the right -Biceps DTR: 2/4 on the left, 2/4 on the right -Achilles DTR: 2/4 on the left, 2/4 on the right -Patellar tendon DTR: 2/4 on the left, 2/4 on the right  -Spurling: Positive on the right, negative on the left -Hoffman sign: Negative bilaterally -Clonus: No beats bilaterally -Interosseous wasting: None seen -Grip and release test: Negative -Romberg: Negative -Gait: Normal  Left shoulder exam: No pain through range of motion, negative Jobe, negative belly press, no weakness with external rotation with arm at side Right shoulder exam: No pain through range of motion, negative Jobe, negative belly press, no weakness with external rotation with arm at side  Tinel's at wrist: Negative bilaterally Durkan's: Negative bilaterally  Tinel's at elbow: Negative bilaterally   Imaging: XR of the cervical spine from 12/30/2022 and 02/05/2023 was independently reviewed and interpreted, showing disc height loss at C6/7. No other significant degenerative changes. No evidence of instability on flexion/extension views. No fracture or dislocation.   MRI of the cervical spine from 01/28/2023 was independently reviewed and interpreted, showing bilateral foraminal stenosis at C5/6. Disc bulge at C6/7 that is larger on the left side. Left sided foraminal stenosis at C6/7.   MRI of the thoracic spine from 01/28/2023 was independently reviewed and interpreted, showing right-sided paracentral disc herniation at T3/4, disc bulge at T7/8.  No T2 cord signal change or central stenosis.   Patient name: Gina Schneider Patient MRN: [id number] Date of visit: 02/05/23

## 2023-02-06 ENCOUNTER — Ambulatory Visit: Payer: Commercial Managed Care - PPO | Admitting: Orthopedic Surgery

## 2023-02-11 ENCOUNTER — Ambulatory Visit: Payer: Commercial Managed Care - PPO | Admitting: Orthopedic Surgery

## 2023-03-09 ENCOUNTER — Ambulatory Visit: Payer: Commercial Managed Care - PPO

## 2023-03-12 ENCOUNTER — Ambulatory Visit: Payer: Commercial Managed Care - PPO | Attending: Physician Assistant

## 2023-03-12 DIAGNOSIS — R293 Abnormal posture: Secondary | ICD-10-CM | POA: Diagnosis present

## 2023-03-12 DIAGNOSIS — M542 Cervicalgia: Secondary | ICD-10-CM | POA: Diagnosis present

## 2023-03-12 DIAGNOSIS — M546 Pain in thoracic spine: Secondary | ICD-10-CM | POA: Insufficient documentation

## 2023-03-12 DIAGNOSIS — M6281 Muscle weakness (generalized): Secondary | ICD-10-CM | POA: Insufficient documentation

## 2023-03-12 DIAGNOSIS — R252 Cramp and spasm: Secondary | ICD-10-CM
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
## 2023-03-16 ENCOUNTER — Ambulatory Visit: Payer: Commercial Managed Care - PPO

## 2023-03-16 DIAGNOSIS — M546 Pain in thoracic spine: Secondary | ICD-10-CM

## 2023-03-16 DIAGNOSIS — R252 Cramp and spasm: Secondary | ICD-10-CM

## 2023-03-16 DIAGNOSIS — M6281 Muscle weakness (generalized): Secondary | ICD-10-CM

## 2023-03-16 DIAGNOSIS — M542 Cervicalgia: Secondary | ICD-10-CM
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
## 2023-03-19 ENCOUNTER — Ambulatory Visit: Payer: Commercial Managed Care - PPO

## 2023-03-19 DIAGNOSIS — R252 Cramp and spasm: Secondary | ICD-10-CM

## 2023-03-19 DIAGNOSIS — M546 Pain in thoracic spine: Secondary | ICD-10-CM

## 2023-03-19 DIAGNOSIS — R293 Abnormal posture: Secondary | ICD-10-CM

## 2023-03-19 DIAGNOSIS — M6281 Muscle weakness (generalized): Secondary | ICD-10-CM

## 2023-03-19 DIAGNOSIS — M542 Cervicalgia: Secondary | ICD-10-CM
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
## 2023-03-23 ENCOUNTER — Ambulatory Visit: Payer: Commercial Managed Care - PPO

## 2023-03-23 DIAGNOSIS — M6281 Muscle weakness (generalized): Secondary | ICD-10-CM

## 2023-03-23 DIAGNOSIS — M546 Pain in thoracic spine: Secondary | ICD-10-CM | POA: Diagnosis not present

## 2023-03-23 DIAGNOSIS — R252 Cramp and spasm: Secondary | ICD-10-CM

## 2023-03-23 DIAGNOSIS — M542 Cervicalgia: Secondary | ICD-10-CM
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
## 2023-03-26 ENCOUNTER — Ambulatory Visit: Payer: Commercial Managed Care - PPO | Attending: Physician Assistant

## 2023-03-26 DIAGNOSIS — M542 Cervicalgia: Secondary | ICD-10-CM | POA: Diagnosis present

## 2023-03-26 DIAGNOSIS — R293 Abnormal posture: Secondary | ICD-10-CM | POA: Diagnosis present

## 2023-03-26 DIAGNOSIS — R252 Cramp and spasm: Secondary | ICD-10-CM | POA: Diagnosis present

## 2023-03-26 DIAGNOSIS — M6281 Muscle weakness (generalized): Secondary | ICD-10-CM

## 2023-03-26 DIAGNOSIS — M546 Pain in thoracic spine: Secondary | ICD-10-CM | POA: Diagnosis present
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
## 2023-04-01 ENCOUNTER — Ambulatory Visit: Payer: Commercial Managed Care - PPO

## 2023-04-01 DIAGNOSIS — M6281 Muscle weakness (generalized): Secondary | ICD-10-CM

## 2023-04-01 DIAGNOSIS — M542 Cervicalgia: Secondary | ICD-10-CM

## 2023-04-01 DIAGNOSIS — M546 Pain in thoracic spine: Secondary | ICD-10-CM | POA: Diagnosis not present

## 2023-04-01 DIAGNOSIS — R293 Abnormal posture: Secondary | ICD-10-CM

## 2023-04-01 DIAGNOSIS — R252 Cramp and spasm: Secondary | ICD-10-CM
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
## 2023-04-03 ENCOUNTER — Other Ambulatory Visit: Payer: Self-pay | Admitting: Endocrinology

## 2023-04-03 DIAGNOSIS — Z1231 Encounter for screening mammogram for malignant neoplasm of breast: Secondary | ICD-10-CM

## 2023-04-07 ENCOUNTER — Ambulatory Visit: Payer: Commercial Managed Care - PPO

## 2023-04-07 ENCOUNTER — Telehealth: Payer: Self-pay

## 2023-04-07 NOTE — Telephone Encounter (Signed)
PT called pt due to no-show appt.  Voicemail was full and PT not able to leave a message

## 2023-04-09 ENCOUNTER — Ambulatory Visit: Payer: Commercial Managed Care - PPO

## 2023-04-09 DIAGNOSIS — M6281 Muscle weakness (generalized): Secondary | ICD-10-CM

## 2023-04-09 DIAGNOSIS — R252 Cramp and spasm: Secondary | ICD-10-CM

## 2023-04-09 DIAGNOSIS — R293 Abnormal posture: Secondary | ICD-10-CM

## 2023-04-09 DIAGNOSIS — M546 Pain in thoracic spine: Secondary | ICD-10-CM | POA: Diagnosis not present

## 2023-04-09 DIAGNOSIS — M542 Cervicalgia: Secondary | ICD-10-CM
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
## 2023-04-15 ENCOUNTER — Ambulatory Visit: Payer: Commercial Managed Care - PPO | Admitting: Rehabilitative and Restorative Service Providers"

## 2023-04-17 ENCOUNTER — Ambulatory Visit: Payer: Commercial Managed Care - PPO | Admitting: Rehabilitative and Restorative Service Providers"

## 2023-04-21 ENCOUNTER — Encounter: Payer: Self-pay | Admitting: Rehabilitative and Restorative Service Providers"

## 2023-04-22 ENCOUNTER — Ambulatory Visit
Admission: RE | Admit: 2023-04-22 | Discharge: 2023-04-22 | Disposition: A | Discharge status: Home / Self Care | Payer: Commercial Managed Care - PPO | Source: Ambulatory Visit | Attending: Endocrinology | Admitting: Endocrinology

## 2023-04-22 ENCOUNTER — Ambulatory Visit: Payer: Commercial Managed Care - PPO | Admitting: Rehabilitative and Restorative Service Providers"

## 2023-04-22 DIAGNOSIS — Z1231 Encounter for screening mammogram for malignant neoplasm of breast: Secondary | ICD-10-CM

## 2023-04-27 ENCOUNTER — Ambulatory Visit
Payer: Commercial Managed Care - PPO | Attending: Physician Assistant | Admitting: Rehabilitative and Restorative Service Providers"

## 2023-04-27 ENCOUNTER — Encounter: Payer: Self-pay | Admitting: Rehabilitative and Restorative Service Providers"

## 2023-04-27 DIAGNOSIS — R293 Abnormal posture: Secondary | ICD-10-CM | POA: Diagnosis present

## 2023-04-27 DIAGNOSIS — R252 Cramp and spasm: Secondary | ICD-10-CM | POA: Diagnosis present

## 2023-04-27 DIAGNOSIS — M6281 Muscle weakness (generalized): Secondary | ICD-10-CM | POA: Insufficient documentation

## 2023-04-27 DIAGNOSIS — M542 Cervicalgia: Secondary | ICD-10-CM | POA: Diagnosis present

## 2023-04-27 DIAGNOSIS — M546 Pain in thoracic spine: Secondary | ICD-10-CM | POA: Diagnosis present
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
## 2023-04-28 ENCOUNTER — Ambulatory Visit: Payer: Commercial Managed Care - PPO

## 2023-04-30 ENCOUNTER — Encounter: Payer: Commercial Managed Care - PPO | Admitting: Rehabilitative and Restorative Service Providers"

## 2023-05-04 ENCOUNTER — Ambulatory Visit: Payer: Commercial Managed Care - PPO

## 2023-05-04 DIAGNOSIS — R252 Cramp and spasm: Secondary | ICD-10-CM

## 2023-05-04 DIAGNOSIS — R293 Abnormal posture: Secondary | ICD-10-CM

## 2023-05-04 DIAGNOSIS — M546 Pain in thoracic spine: Secondary | ICD-10-CM | POA: Diagnosis not present

## 2023-05-04 DIAGNOSIS — M6281 Muscle weakness (generalized): Secondary | ICD-10-CM

## 2023-05-04 DIAGNOSIS — M542 Cervicalgia: Secondary | ICD-10-CM
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
## 2023-05-07 ENCOUNTER — Ambulatory Visit: Payer: Commercial Managed Care - PPO

## 2023-10-13 ENCOUNTER — Other Ambulatory Visit: Payer: Self-pay | Admitting: Orthopedic Surgery

## 2024-02-22 ENCOUNTER — Encounter (HOSPITAL_BASED_OUTPATIENT_CLINIC_OR_DEPARTMENT_OTHER): Payer: Self-pay | Admitting: Emergency Medicine

## 2024-02-22 ENCOUNTER — Other Ambulatory Visit: Payer: Self-pay

## 2024-02-22 ENCOUNTER — Other Ambulatory Visit (HOSPITAL_BASED_OUTPATIENT_CLINIC_OR_DEPARTMENT_OTHER): Payer: Self-pay

## 2024-02-22 ENCOUNTER — Emergency Department (HOSPITAL_BASED_OUTPATIENT_CLINIC_OR_DEPARTMENT_OTHER)
Admission: EM | Admit: 2024-02-22 | Discharge: 2024-02-22 | Disposition: A | Discharge status: Home / Self Care | Attending: Emergency Medicine | Admitting: Emergency Medicine

## 2024-02-22 ENCOUNTER — Emergency Department (HOSPITAL_BASED_OUTPATIENT_CLINIC_OR_DEPARTMENT_OTHER): Admitting: Radiology

## 2024-02-22 DIAGNOSIS — R197 Diarrhea, unspecified: Secondary | ICD-10-CM | POA: Diagnosis not present

## 2024-02-22 DIAGNOSIS — J45909 Unspecified asthma, uncomplicated: Secondary | ICD-10-CM | POA: Diagnosis not present

## 2024-02-22 DIAGNOSIS — R0602 Shortness of breath: Secondary | ICD-10-CM | POA: Insufficient documentation

## 2024-02-22 DIAGNOSIS — R5383 Other fatigue: Secondary | ICD-10-CM | POA: Diagnosis not present

## 2024-02-22 DIAGNOSIS — R252 Cramp and spasm: Secondary | ICD-10-CM | POA: Diagnosis present

## 2024-02-22 LAB — CBC WITH DIFFERENTIAL/PLATELET
Abs Immature Granulocytes: 0.02 10*3/uL (ref 0.00–0.07)
Basophils Absolute: 0 10*3/uL (ref 0.0–0.1)
Basophils Relative: 1 %
Eosinophils Absolute: 0.5 10*3/uL (ref 0.0–0.5)
Eosinophils Relative: 7 %
HCT: 45.3 % (ref 36.0–46.0)
Hemoglobin: 15 g/dL (ref 12.0–15.0)
Immature Granulocytes: 0 %
Lymphocytes Relative: 18 %
Lymphs Abs: 1.5 10*3/uL (ref 0.7–4.0)
MCH: 29.2 pg (ref 26.0–34.0)
MCHC: 33.1 g/dL (ref 30.0–36.0)
MCV: 88.3 fL (ref 80.0–100.0)
Monocytes Absolute: 0.7 10*3/uL (ref 0.1–1.0)
Monocytes Relative: 9 %
Neutro Abs: 5.4 10*3/uL (ref 1.7–7.7)
Neutrophils Relative %: 65 %
Platelets: 274 10*3/uL (ref 150–400)
RBC: 5.13 MIL/uL — ABNORMAL HIGH (ref 3.87–5.11)
RDW: 13.2 % (ref 11.5–15.5)
WBC: 8.2 10*3/uL (ref 4.0–10.5)
nRBC: 0 % (ref 0.0–0.2)

## 2024-02-22 LAB — COMPREHENSIVE METABOLIC PANEL WITH GFR
ALT: 28 U/L (ref 0–44)
AST: 22 U/L (ref 15–41)
Albumin: 4 g/dL (ref 3.5–5.0)
Alkaline Phosphatase: 70 U/L (ref 38–126)
Anion gap: 8 (ref 5–15)
BUN: <5 mg/dL — ABNORMAL LOW (ref 6–20)
CO2: 22 mmol/L (ref 22–32)
Calcium: 8.3 mg/dL — ABNORMAL LOW (ref 8.9–10.3)
Chloride: 107 mmol/L (ref 98–111)
Creatinine, Ser: 0.81 mg/dL (ref 0.44–1.00)
GFR, Estimated: >60 mL/min (ref >60)
Glucose, Bld: 98 mg/dL (ref 70–99)
Potassium: 4.2 mmol/L (ref 3.5–5.1)
Sodium: 137 mmol/L (ref 135–145)
Total Bilirubin: 0.3 mg/dL (ref 0.0–1.2)
Total Protein: 6.9 g/dL (ref 6.5–8.1)

## 2024-02-22 LAB — HCG, SERUM, QUALITATIVE: Preg, Serum: NEGATIVE

## 2024-02-22 LAB — MAGNESIUM: Magnesium: 2.1 mg/dL (ref 1.7–2.4)

## 2024-02-22 LAB — CK: Total CK: 227 U/L (ref 38–234)

## 2024-02-22 LAB — RESP PANEL BY RT-PCR (RSV, FLU A&B, COVID)  RVPGX2
Influenza A by PCR: NEGATIVE
Influenza B by PCR: NEGATIVE
Resp Syncytial Virus by PCR: NEGATIVE
SARS Coronavirus 2 by RT PCR: NEGATIVE

## 2024-02-22 LAB — LIPASE, BLOOD: Lipase: 20 U/L (ref 11–51)

## 2024-02-22 MED ORDER — ONDANSETRON 4 MG PO TBDP
4.0000 mg | ORAL_TABLET | Freq: Three times a day (TID) | ORAL | 0 refills | Status: AC | PRN
  Filled 2024-02-22: qty 20, 7d supply, fill #0

## 2024-02-22 MED ORDER — LOPERAMIDE HCL 2 MG PO CAPS
2.0000 mg | ORAL_CAPSULE | Freq: Four times a day (QID) | ORAL | 0 refills | Status: AC | PRN
Start: 2024-02-22 — End: ?
  Filled 2024-02-22: qty 12, 3d supply, fill #0

## 2024-02-22 MED ORDER — SODIUM CHLORIDE 0.9 % IV BOLUS
1000.0000 mL | Freq: Once | INTRAVENOUS | Status: AC
  Administered 2024-02-22: 1000 mL via INTRAVENOUS
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
## 2024-02-22 NOTE — Discharge Instructions (Signed)
 Your lab work is normal here.  Please continue to hydrate and rest at home.  I expect your symptoms to get better over the next couple of days.  I have called in medicines to help with any nausea or diarrhea you may experience the next few days.  Return to the emergency department any new or suddenly worsening symptoms such as chest pain, worsening trouble breathing, passing out, etc.

## 2024-02-22 NOTE — ED Triage Notes (Signed)
 Pt endorses running marathon x 2 days pta and c/o shob, tachypnea, muscle coordination weakness and diarrhea

## 2024-02-22 NOTE — ED Notes (Signed)
 Discharge paperwork given and verbally understood.

## 2024-03-30 ENCOUNTER — Other Ambulatory Visit: Payer: Self-pay | Admitting: Endocrinology

## 2024-03-30 DIAGNOSIS — Z1231 Encounter for screening mammogram for malignant neoplasm of breast: Secondary | ICD-10-CM

## 2024-04-15 ENCOUNTER — Other Ambulatory Visit (HOSPITAL_BASED_OUTPATIENT_CLINIC_OR_DEPARTMENT_OTHER): Payer: Self-pay

## 2024-04-15 MED ORDER — NYSTATIN 100000 UNIT/ML MT SUSP
1000000.0000 [IU] | Freq: Four times a day (QID) | OROMUCOSAL | 0 refills | Status: AC
  Filled 2024-04-15: qty 500, 13d supply, fill #0

## 2024-04-22 ENCOUNTER — Ambulatory Visit

## 2024-04-22 ENCOUNTER — Other Ambulatory Visit (HOSPITAL_BASED_OUTPATIENT_CLINIC_OR_DEPARTMENT_OTHER): Payer: Self-pay

## 2024-04-22 MED ORDER — WEGOVY 0.25 MG/0.5ML ~~LOC~~ SOAJ
0.2500 mg | SUBCUTANEOUS | 0 refills | Status: DC
  Filled 2024-04-22: qty 2, 28d supply, fill #0

## 2024-04-27 ENCOUNTER — Ambulatory Visit
Admission: RE | Admit: 2024-04-27 | Discharge: 2024-04-27 | Disposition: A | Discharge status: Home / Self Care | Source: Ambulatory Visit | Attending: Endocrinology | Admitting: Endocrinology

## 2024-04-27 DIAGNOSIS — Z1231 Encounter for screening mammogram for malignant neoplasm of breast: Secondary | ICD-10-CM

## 2024-05-18 ENCOUNTER — Other Ambulatory Visit (HOSPITAL_BASED_OUTPATIENT_CLINIC_OR_DEPARTMENT_OTHER): Payer: Self-pay

## 2024-05-18 MED ORDER — WEGOVY 0.25 MG/0.5ML ~~LOC~~ SOAJ
0.2500 mg | SUBCUTANEOUS | 0 refills | Status: AC
  Filled 2024-05-18: qty 2, 28d supply, fill #0

## 2024-06-14 ENCOUNTER — Other Ambulatory Visit (HOSPITAL_BASED_OUTPATIENT_CLINIC_OR_DEPARTMENT_OTHER): Payer: Self-pay

## 2024-06-14 MED ORDER — WEGOVY 0.5 MG/0.5ML ~~LOC~~ SOAJ
0.5000 mg | SUBCUTANEOUS | 0 refills | Status: DC
  Filled 2024-06-14: qty 2, 28d supply, fill #0

## 2024-06-16 ENCOUNTER — Other Ambulatory Visit (HOSPITAL_BASED_OUTPATIENT_CLINIC_OR_DEPARTMENT_OTHER): Payer: Self-pay

## 2024-07-07 ENCOUNTER — Other Ambulatory Visit (HOSPITAL_BASED_OUTPATIENT_CLINIC_OR_DEPARTMENT_OTHER): Payer: Self-pay

## 2024-07-07 MED ORDER — WEGOVY 0.5 MG/0.5ML ~~LOC~~ SOAJ
0.5000 mg | SUBCUTANEOUS | 0 refills | Status: AC
  Filled 2024-07-07: qty 2, 28d supply, fill #0

## 2024-07-13 ENCOUNTER — Other Ambulatory Visit (HOSPITAL_BASED_OUTPATIENT_CLINIC_OR_DEPARTMENT_OTHER): Payer: Self-pay

## 2024-07-15 ENCOUNTER — Other Ambulatory Visit (HOSPITAL_BASED_OUTPATIENT_CLINIC_OR_DEPARTMENT_OTHER): Payer: Self-pay

## 2024-07-16 ENCOUNTER — Other Ambulatory Visit (HOSPITAL_BASED_OUTPATIENT_CLINIC_OR_DEPARTMENT_OTHER): Payer: Self-pay

## 2024-07-18 ENCOUNTER — Other Ambulatory Visit (HOSPITAL_BASED_OUTPATIENT_CLINIC_OR_DEPARTMENT_OTHER): Payer: Self-pay

## 2024-07-18 MED ORDER — CELECOXIB 200 MG PO CAPS
200.0000 mg | ORAL_CAPSULE | Freq: Two times a day (BID) | ORAL | 0 refills | Status: AC
  Filled 2024-07-18: qty 60, 30d supply, fill #0

## 2024-07-18 MED ORDER — TRAMADOL HCL 50 MG PO TABS
50.0000 mg | ORAL_TABLET | Freq: Four times a day (QID) | ORAL | 0 refills | Status: AC | PRN
  Filled 2024-07-18: qty 30, 8d supply, fill #0

## 2024-07-21 ENCOUNTER — Other Ambulatory Visit (HOSPITAL_BASED_OUTPATIENT_CLINIC_OR_DEPARTMENT_OTHER): Payer: Self-pay

## 2024-08-03 ENCOUNTER — Other Ambulatory Visit (HOSPITAL_BASED_OUTPATIENT_CLINIC_OR_DEPARTMENT_OTHER): Payer: Self-pay

## 2024-08-16 ENCOUNTER — Encounter: Payer: Self-pay | Admitting: Physician Assistant

## 2024-08-16 ENCOUNTER — Ambulatory Visit: Admitting: Physician Assistant

## 2024-08-16 DIAGNOSIS — S93401A Sprain of unspecified ligament of right ankle, initial encounter: Secondary | ICD-10-CM | POA: Diagnosis not present

## 2024-08-16 DIAGNOSIS — M722 Plantar fascial fibromatosis: Secondary | ICD-10-CM | POA: Diagnosis not present
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
## 2024-08-16 NOTE — Addendum Note (Signed)
 Addended by: GEROME MAURILIO HERO on: 08/16/2024 09:49 AM   Modules accepted: Orders

## 2024-09-06 ENCOUNTER — Ambulatory Visit: Admitting: Sports Medicine

## 2024-09-06 ENCOUNTER — Encounter: Payer: Self-pay | Admitting: Sports Medicine

## 2024-09-06 DIAGNOSIS — S93401D Sprain of unspecified ligament of right ankle, subsequent encounter: Secondary | ICD-10-CM | POA: Diagnosis not present

## 2024-09-06 DIAGNOSIS — M7989 Other specified soft tissue disorders: Secondary | ICD-10-CM

## 2024-09-06 DIAGNOSIS — M25371 Other instability, right ankle: Secondary | ICD-10-CM | POA: Diagnosis not present

## 2024-09-06 DIAGNOSIS — M722 Plantar fascial fibromatosis: Secondary | ICD-10-CM

## 2024-09-06 NOTE — Progress Notes (Signed)
 Patient says that she had a fall at the beginning of August, where she landed with her right ankle inverted. Since then, she has had swelling in the right lower leg and ankle, as well as pain in the bottom of the heel. She has continued doing pilates and weightlifting, although she has taken a break from running. She has been referred to physical therapy but has not yet scheduled her first appointment.
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
## 2024-09-15 ENCOUNTER — Ambulatory Visit

## 2024-09-19 ENCOUNTER — Ambulatory Visit (INDEPENDENT_AMBULATORY_CARE_PROVIDER_SITE_OTHER): Admitting: Sports Medicine

## 2024-09-19 DIAGNOSIS — M722 Plantar fascial fibromatosis: Secondary | ICD-10-CM | POA: Diagnosis not present

## 2024-09-19 DIAGNOSIS — S93401D Sprain of unspecified ligament of right ankle, subsequent encounter: Secondary | ICD-10-CM

## 2024-09-19 DIAGNOSIS — M546 Pain in thoracic spine: Secondary | ICD-10-CM | POA: Diagnosis not present

## 2024-09-19 DIAGNOSIS — G8929 Other chronic pain: Secondary | ICD-10-CM

## 2024-09-19 DIAGNOSIS — M25371 Other instability, right ankle: Secondary | ICD-10-CM | POA: Diagnosis not present

## 2024-09-19 NOTE — Progress Notes (Signed)
 Patient says that she noticed no change with her foot after the first shockwave therapy. She does have more pain in the heel than she did previously. She denies having significant soreness or tenderness after her first treatment and is here for repeat treatment today. She also has questions regarding her back and whether shockwave therapy might be beneficial for that pain
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
## 2024-09-21 ENCOUNTER — Ambulatory Visit (INDEPENDENT_AMBULATORY_CARE_PROVIDER_SITE_OTHER)

## 2024-09-21 DIAGNOSIS — R600 Localized edema: Secondary | ICD-10-CM | POA: Diagnosis not present

## 2024-09-21 DIAGNOSIS — M25672 Stiffness of left ankle, not elsewhere classified: Secondary | ICD-10-CM

## 2024-09-21 DIAGNOSIS — M25571 Pain in right ankle and joints of right foot: Secondary | ICD-10-CM | POA: Diagnosis not present

## 2024-09-21 DIAGNOSIS — R2689 Other abnormalities of gait and mobility: Secondary | ICD-10-CM

## 2024-09-21 DIAGNOSIS — M6281 Muscle weakness (generalized): Secondary | ICD-10-CM

## 2024-09-21 DIAGNOSIS — M25572 Pain in left ankle and joints of left foot: Secondary | ICD-10-CM

## 2024-09-22 ENCOUNTER — Other Ambulatory Visit (HOSPITAL_COMMUNITY): Payer: Self-pay

## 2024-09-22 ENCOUNTER — Other Ambulatory Visit (HOSPITAL_BASED_OUTPATIENT_CLINIC_OR_DEPARTMENT_OTHER): Payer: Self-pay

## 2024-09-22 ENCOUNTER — Other Ambulatory Visit: Payer: Self-pay

## 2024-09-22 ENCOUNTER — Telehealth: Payer: Self-pay | Admitting: Sports Medicine

## 2024-09-22 MED ORDER — EPINEPHRINE 0.3 MG/0.3ML IJ SOAJ
INTRAMUSCULAR | 0 refills | Status: AC
  Filled 2024-09-22: qty 2, 28d supply, fill #0

## 2024-09-22 MED ORDER — LEVALBUTEROL TARTRATE 45 MCG/ACT IN AERO
2.0000 | INHALATION_SPRAY | RESPIRATORY_TRACT | 3 refills | Status: AC | PRN
  Filled 2024-09-22: qty 15, 17d supply, fill #0

## 2024-09-22 MED ORDER — ALBUTEROL SULFATE 1.25 MG/3ML IN NEBU
3.0000 mL | INHALATION_SOLUTION | Freq: Four times a day (QID) | RESPIRATORY_TRACT | 0 refills | Status: AC | PRN
  Filled 2024-09-22 (×2): qty 90, 8d supply, fill #0
  Filled 2024-09-22: qty 75, 7d supply, fill #0

## 2024-09-22 NOTE — Telephone Encounter (Signed)
 Patient called and said that she needs to reschedule her week apart appointments for between 11-2pm. She needs to reschedule for

## 2024-09-23 NOTE — Telephone Encounter (Signed)
 Called patient to schedule and left voicemail. If needs 11am-2pm, patient can be scheduled at the end of the morning (10:45am) or after lunch (as early as 1:00pm). Can schedule for next available appointment.

## 2024-09-26 ENCOUNTER — Ambulatory Visit: Admitting: Sports Medicine

## 2024-09-26 ENCOUNTER — Encounter: Payer: Self-pay | Admitting: Radiology

## 2024-10-03 ENCOUNTER — Other Ambulatory Visit (HOSPITAL_BASED_OUTPATIENT_CLINIC_OR_DEPARTMENT_OTHER): Payer: Self-pay

## 2024-10-03 ENCOUNTER — Ambulatory Visit: Admitting: Sports Medicine

## 2024-12-21 ENCOUNTER — Other Ambulatory Visit (HOSPITAL_BASED_OUTPATIENT_CLINIC_OR_DEPARTMENT_OTHER): Payer: Self-pay

## 2024-12-21 ENCOUNTER — Other Ambulatory Visit: Payer: Self-pay

## 2024-12-21 MED ORDER — PHENTERMINE HCL 15 MG PO CAPS
15.0000 mg | ORAL_CAPSULE | Freq: Every day | ORAL | 0 refills | Status: AC
  Filled 2024-12-21 (×2): qty 30, 30d supply, fill #0

## 2024-12-21 MED ORDER — WEGOVY 0.25 MG/0.5ML ~~LOC~~ SOAJ
0.2500 mg | SUBCUTANEOUS | 0 refills | Status: AC
  Filled 2024-12-21: qty 2, 28d supply, fill #0

## 2024-12-27 ENCOUNTER — Other Ambulatory Visit (HOSPITAL_BASED_OUTPATIENT_CLINIC_OR_DEPARTMENT_OTHER): Payer: Self-pay

## 2024-12-28 ENCOUNTER — Other Ambulatory Visit (HOSPITAL_BASED_OUTPATIENT_CLINIC_OR_DEPARTMENT_OTHER): Payer: Self-pay
